# Patient Record
Sex: Female | Born: 1985 | Race: White | Hispanic: No | Marital: Married | Smoking: Never smoker
Health system: Southern US, Community
[De-identification: ages and names within clinical notes are randomized; demographics above are authoritative.]

## PROBLEM LIST (undated history)

## (undated) DIAGNOSIS — K219 Gastro-esophageal reflux disease without esophagitis: Secondary | ICD-10-CM

## (undated) DIAGNOSIS — R000 Tachycardia, unspecified: Secondary | ICD-10-CM

## (undated) DIAGNOSIS — J45909 Unspecified asthma, uncomplicated: Secondary | ICD-10-CM

## (undated) DIAGNOSIS — D649 Anemia, unspecified: Secondary | ICD-10-CM

## (undated) DIAGNOSIS — Z9889 Other specified postprocedural states: Secondary | ICD-10-CM

## (undated) DIAGNOSIS — Z8759 Personal history of other complications of pregnancy, childbirth and the puerperium: Secondary | ICD-10-CM

## (undated) DIAGNOSIS — G43909 Migraine, unspecified, not intractable, without status migrainosus: Secondary | ICD-10-CM

## (undated) DIAGNOSIS — Q2381 Bicuspid aortic valve: Secondary | ICD-10-CM

## (undated) DIAGNOSIS — Q231 Congenital insufficiency of aortic valve: Secondary | ICD-10-CM

## (undated) DIAGNOSIS — R112 Nausea with vomiting, unspecified: Secondary | ICD-10-CM

## (undated) HISTORY — PX: WRIST SURGERY: SHX841

## (undated) HISTORY — PX: FRACTURE SURGERY: SHX138

## (undated) HISTORY — DX: Unspecified asthma, uncomplicated: J45.909

## (undated) HISTORY — DX: Migraine, unspecified, not intractable, without status migrainosus: G43.909

## (undated) HISTORY — PX: TONSILLECTOMY: SUR1361

---
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
## 2005-01-04 ENCOUNTER — Other Ambulatory Visit: Admission: RE | Admit: 2005-01-04 | Discharge: 2005-01-04 | Payer: Self-pay | Admitting: Obstetrics and Gynecology

## 2005-01-15 ENCOUNTER — Other Ambulatory Visit: Admission: RE | Admit: 2005-01-15 | Discharge: 2005-01-15 | Payer: Self-pay | Admitting: Obstetrics and Gynecology

## 2005-07-11 ENCOUNTER — Other Ambulatory Visit: Admission: RE | Admit: 2005-07-11 | Discharge: 2005-07-11 | Payer: Self-pay | Admitting: Obstetrics & Gynecology

## 2006-08-02 ENCOUNTER — Other Ambulatory Visit: Admission: RE | Admit: 2006-08-02 | Discharge: 2006-08-02 | Payer: Self-pay | Admitting: Obstetrics and Gynecology

## 2007-09-29 ENCOUNTER — Other Ambulatory Visit: Admission: RE | Admit: 2007-09-29 | Discharge: 2007-09-29 | Payer: Self-pay | Admitting: Obstetrics and Gynecology

## 2007-11-30 ENCOUNTER — Encounter: Admission: RE | Admit: 2007-11-30 | Discharge: 2007-11-30 | Payer: Self-pay | Admitting: Family Medicine

## 2010-04-16 ENCOUNTER — Encounter: Payer: Self-pay | Admitting: Surgery

## 2012-05-13 ENCOUNTER — Emergency Department (HOSPITAL_COMMUNITY)
Admission: EM | Admit: 2012-05-13 | Discharge: 2012-05-13 | Disposition: A | Discharge status: Home / Self Care | Payer: BC Managed Care – PPO | Attending: Emergency Medicine | Admitting: Emergency Medicine

## 2012-05-13 ENCOUNTER — Emergency Department (HOSPITAL_COMMUNITY): Payer: BC Managed Care – PPO

## 2012-05-13 DIAGNOSIS — F430 Acute stress reaction: Secondary | ICD-10-CM | POA: Insufficient documentation

## 2012-05-13 DIAGNOSIS — R0789 Other chest pain: Secondary | ICD-10-CM | POA: Insufficient documentation

## 2012-05-13 DIAGNOSIS — Z79899 Other long term (current) drug therapy: Secondary | ICD-10-CM | POA: Insufficient documentation

## 2012-05-13 DIAGNOSIS — R61 Generalized hyperhidrosis: Secondary | ICD-10-CM | POA: Insufficient documentation

## 2012-05-13 DIAGNOSIS — G43909 Migraine, unspecified, not intractable, without status migrainosus: Secondary | ICD-10-CM | POA: Insufficient documentation

## 2012-05-13 DIAGNOSIS — Z862 Personal history of diseases of the blood and blood-forming organs and certain disorders involving the immune mechanism: Secondary | ICD-10-CM | POA: Insufficient documentation

## 2012-05-13 DIAGNOSIS — F439 Reaction to severe stress, unspecified: Secondary | ICD-10-CM

## 2012-05-13 DIAGNOSIS — R000 Tachycardia, unspecified: Secondary | ICD-10-CM

## 2012-05-13 DIAGNOSIS — I498 Other specified cardiac arrhythmias: Secondary | ICD-10-CM | POA: Insufficient documentation

## 2012-05-13 DIAGNOSIS — Z8639 Personal history of other endocrine, nutritional and metabolic disease: Secondary | ICD-10-CM | POA: Insufficient documentation

## 2012-05-13 DIAGNOSIS — J45909 Unspecified asthma, uncomplicated: Secondary | ICD-10-CM | POA: Insufficient documentation

## 2012-05-13 LAB — BASIC METABOLIC PANEL
Creatinine, Ser: 0.85 mg/dL (ref 0.50–1.10)
Glucose, Bld: 98 mg/dL (ref 70–99)

## 2012-05-13 LAB — RAPID URINE DRUG SCREEN, HOSP PERFORMED
Barbiturates: NOT DETECTED
Benzodiazepines: NOT DETECTED
Tetrahydrocannabinol: NOT DETECTED

## 2012-05-13 LAB — POCT I-STAT TROPONIN I: Troponin i, poc: 0 ng/mL (ref 0.00–0.08)

## 2012-05-13 MED ORDER — SODIUM CHLORIDE 0.9 % IV BOLUS (SEPSIS)
1000.0000 mL | Freq: Once | INTRAVENOUS | Status: AC
  Administered 2012-05-13: 1000 mL via INTRAVENOUS

## 2012-05-13 MED ORDER — GI COCKTAIL ~~LOC~~
30.0000 mL | Freq: Once | ORAL | Status: AC
  Administered 2012-05-13: 30 mL via ORAL
  Filled 2012-05-13: qty 30

## 2012-05-13 NOTE — ED Notes (Signed)
Given asa and ntg and on arrival 2/10 pain, last year diagnosed with high cholesterol no meds.

## 2012-05-13 NOTE — ED Provider Notes (Signed)
  I performed a history and physical examination of Theresa Lawrence and discussed her management with Dr.McLean.  I agree with the history, physical, assessment, and plan of care, with the following exceptions: None     Theresa Lawrence, Theresa Coil, MD 05/13/12 2206

## 2012-05-13 NOTE — ED Provider Notes (Signed)
History     CSN: [id number]  Arrival date & time 05/13/12  1401   First MD Initiated Contact with Patient 05/13/12 1501      Chief Complaint  Patient presents with  . Chest Pain    (Consider location/radiation/quality/duration/timing/severity/associated sxs/prior treatment) HPI Comments: 27 y.o PMH migraines, dyslipidemia, exercise induced asthma.    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  She describes the discomfort as sharp, pressure, tight max discomfort was 7/10 then went to 4/10.  Discomfort was relieved by Aspirin 325 and NTG, and oxygen given by EMS at 1:30-2 PM.  She also noted this chest discomfort yesterday.  Sensation radiated to left arm and 3rd and 4th fingers of left hand.  She has never had this before.  She denies n/v but reports she was sweating at the time of onset.  Denies sob. She recently had an engagement breakup 3-4 weeks ago.  Per her mother she has been trying to deal with this on her own, decreased appetite, very emotional.  She now lives with her mother.  Patient states she recently was trying to set up an appointment with a counselor.    PCP: [facility]) Meds: noted; denies OCP. Stopped 1 week ago  SH: dance teacher  Patient is a 27 y.o. female presenting with chest pain. The history is provided by the patient and a parent. No language interpreter was used.  Chest Pain Pain location:  L chest (mid chest ) Pain quality: pressure, sharp and tightness   Pain radiates to:  L arm (3rd and 4th fingers left hand ) Pain severity:  No pain (no pain currently since NTG and ASA 325 mg  per EMS which helped ) Onset quality:  Sudden Progression:  Resolved Chronicity:  New Context: stress   Context: not breathing   Relieved by:  Oxygen, nitroglycerin and aspirin Associated symptoms: diaphoresis   Associated symptoms: no abdominal pain, no anxiety, no nausea, no shortness of  breath and not vomiting   Risk factors: high cholesterol   Risk factors: no birth control and no smoking     PMH: migraines, exercise induced asthma, dyslipidemia   PsuH: tonsillectomy and adenoidectomy age 41  SH: Denies cigarettes  Medications: see chart for meds FH: father cabg 56 y.o, maternal GF history of MI  History  Substance Use Topics  . Smoking status: Not on file  . Smokeless tobacco: Not on file  . Alcohol Use: Not on file    OB History   No data available      Review of Systems  Constitutional: Positive for diaphoresis.  HENT: Negative for sore throat.   Respiratory: Negative for shortness of breath.   Cardiovascular: Positive for chest pain.  Gastrointestinal: Negative for nausea, vomiting and abdominal pain.  Psychiatric/Behavioral:       Stressors   All other systems reviewed and are negative.    Allergies  Other and Sulfa antibiotics  Home Medications   Current Outpatient Rx  Name  Route  Sig  Dispense  Refill  . albuterol (PROVENTIL HFA;VENTOLIN HFA) 108 (90 BASE) MCG/ACT inhaler   Inhalation   Inhale into the lungs every 6 (six) hours as needed for wheezing.         Marland Kitchen imipramine (TOFRANIL) 50 MG tablet   Oral   Take 100 mg by mouth at bedtime.         Marland Kitchen ketoprofen (  ORUDIS) 75 MG capsule   Oral   Take 75 mg by mouth 3 (three) times daily as needed for pain.         Marland Kitchen metoCLOPramide (REGLAN) 10 MG tablet   Oral   Take 10 mg by mouth 4 (four) times daily as needed. Takes when takes Imitrex         . SUMAtriptan (IMITREX) 100 MG tablet   Oral   Take 100 mg by mouth every 2 (two) hours as needed for migraine.           BP 120/87  Pulse 109  Temp(Src) 98 F (36.7 C) (Oral)  Resp 15  SpO2 100%  LMP 04/29/2012  Physical Exam  Nursing note and vitals reviewed. Constitutional: She is oriented to person, place, and time. She appears well-developed and well-nourished. She is cooperative. No distress.  HENT:  Head:  Normocephalic and atraumatic.  Mouth/Throat: Oropharynx is clear and moist and mucous membranes are normal. No oropharyngeal exudate.  Eyes: Conjunctivae are normal. Pupils are equal, round, and reactive to light. Right eye exhibits no discharge. Left eye exhibits no discharge. No scleral icterus.  Cardiovascular: Normal rate, regular rhythm, S1 normal, S2 normal and normal heart sounds.   No murmur heard. Pulmonary/Chest: Effort normal and breath sounds normal. No respiratory distress. She has no wheezes.  Reproducible chest wall ttp mild mid to left chest   Abdominal: Soft. Bowel sounds are normal. There is no tenderness.  Neurological: She is alert and oriented to person, place, and time. She has normal strength.  Skin: Skin is warm, dry and intact. No rash noted. She is not diaphoretic.  Psychiatric: She has a normal mood and affect. Her speech is normal and behavior is normal. Judgment and thought content normal. Cognition and memory are normal.  Somewhat tearful when discussing recent break off of engagement    ED Course  Procedures (including critical care time)  Labs Reviewed  BASIC METABOLIC PANEL  URINE RAPID DRUG SCREEN (HOSP PERFORMED)  POCT I-STAT TROPONIN I   Dg Chest 2 View  05/13/2012  *RADIOLOGY REPORT*  Clinical Data: Chest pain  CHEST - 2 VIEW  Comparison: None.  Findings:  Lungs clear.  Heart size and pulmonary vascularity are normal.  No adenopathy.  No pneumothorax.  No bone lesions.  IMPRESSION: No abnormality noted.   Original Report Authenticated By: Bretta Bang, M.D.      1. Chest discomfort, likely noncardiac   2. Stress   3. Sinus tachycardia       Date: 05/13/2012  Rate: 101  Rhythm: sinus tachycardia  QRS Axis: normal  Intervals: normal  ST/T Wave abnormalities: normal  Conduction Disutrbances:none  Narrative Interpretation:   Old EKG Reviewed: none available   MDM  Chest discomfort likely noncardiac (related to anxiety or  stressors) Low TIMI and PERC score risk  No tachypenea, no hypoxia, no pleuritic chest pain noted  BMET, istat trop, EKG F/u with PCP and/or psych or counselor Discharge VS: HR:109  RR:15  O2 sat:100% BP 120/87  Sinus tachycardia Likely related to anxiety, stress, dehydration with decreased oral intake recently  1 L NS bolus   Shirlee Latch MD [phone]         Annett Gula, MD 05/13/12 1641  Annett Gula, MD 05/13/12 1641  Annett Gula, MD 05/13/12 1645  Annett Gula, MD 05/13/12 1702  Annett Gula, MD 05/13/12 1752

## 2012-05-13 NOTE — ED Notes (Signed)
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 12 lead shows ST, hx of asthma.

## 2012-06-17 ENCOUNTER — Ambulatory Visit: Payer: BC Managed Care – PPO

## 2012-06-17 DIAGNOSIS — N390 Urinary tract infection, site not specified: Secondary | ICD-10-CM

## 2012-06-19 LAB — URINE CULTURE
Colony Count: NO GROWTH
Organism ID, Bacteria: NO GROWTH

## 2012-07-18 ENCOUNTER — Encounter: Payer: Self-pay | Admitting: Gynecology

## 2012-07-18 ENCOUNTER — Other Ambulatory Visit: Payer: Self-pay | Admitting: Gynecology

## 2012-07-18 ENCOUNTER — Ambulatory Visit (INDEPENDENT_AMBULATORY_CARE_PROVIDER_SITE_OTHER): Payer: BC Managed Care – PPO | Admitting: Gynecology

## 2012-07-18 VITALS — BP 118/60

## 2012-07-18 DIAGNOSIS — G43909 Migraine, unspecified, not intractable, without status migrainosus: Secondary | ICD-10-CM | POA: Insufficient documentation

## 2012-07-18 DIAGNOSIS — N912 Amenorrhea, unspecified: Secondary | ICD-10-CM

## 2012-07-18 DIAGNOSIS — Q249 Congenital malformation of heart, unspecified: Secondary | ICD-10-CM

## 2012-07-18 DIAGNOSIS — Q239 Congenital malformation of aortic and mitral valves, unspecified: Secondary | ICD-10-CM

## 2012-07-18 MED ORDER — CONCEPT DHA 53.5-38-1 MG PO CAPS: 1.0000 | ORAL_CAPSULE | Freq: Every day | ORAL | Status: DC

## 2012-07-18 MED ORDER — CONCEPT OB 130-92.4-1 MG PO CAPS: 1.0000 | ORAL_CAPSULE | Freq: Every day | ORAL | Status: DC

## 2012-07-18 NOTE — Patient Instructions (Signed)

## 2012-07-18 NOTE — Progress Notes (Signed)
  Subjective:    Theresa Lawrence is a 27 y.o. female who presents for evaluation of amenorrhea. She believes she could be pregnant. Pregnancy is desired. Sexual Activity: single partner, contraception: none. Pt had stopped birth control pills was planning skyla placement.  Pt happy but unplanned. Current symptoms also include: fatigue, morning sickness, nausea and positive home pregnancy test. Last period was normal. But was withdraw bleed from ocp.   Pt was on imitrex and imipramine which she stopped with +UPT.  Pt has histroy of migraines.  She is currently wearing a heart monitor due to an irregular heart rate-Pt diagnosised with bicuspid mitral valve.  Pt is currently seeing cardiology   Patient's last menstrual period was 05/24/2012. The following portions of the patient's history were reviewed and updated as appropriate: allergies, current medications, past family history, past medical history, past social history, past surgical history and problem list.  Review of Systems Pertinent items are noted in HPI.     Objective:    BP 118/60  LMP 05/24/2012 General: alert, cooperative and no acute distress    Lab Review Urine HCG: positive    Assessment:    Absence of menstruation.     Plan:    Pregnancy Test: Positive: EDC: 03/06/2014. Briefly discussed pre-natal care options. Pregnancy, Childbirth and the Newborn book given. Encouraged well-balanced diet, plenty of rest when needed, pre-natal vitamins daily and walking for exercise. Discussed self-help for nausea, avoiding OTC medications until consulting provider or pharmacist, other than Tylenol as needed, minimal caffeine (1-2 cups daily) and avoiding alcohol. She will schedule her initial OB visit in the next month with her PCP or OB provider. Feel free to call with any questions. Pt needs to complete cardiology evaluation to determine appropriate md for care   Pt will get quant HCG here, u/s to confirm viability, will bring results from  cardiology study at follow up, may require perinatology   Recommend trying Vit B6/ 1/2 unisom for nausea. All questions addressed Length of visit , >50% face to face

## 2012-07-21 ENCOUNTER — Telehealth: Payer: Self-pay | Admitting: *Deleted

## 2012-07-21 NOTE — Telephone Encounter (Signed)
Message copied by Lorraine Lax on Mon Jul 21, 2012  4:07 PM ------      Message from: Douglass Rivers      Created: Mon Jul 21, 2012 12:00 PM       Inform BHCG looks great, we will schedule dating u/s ------

## 2012-07-21 NOTE — Telephone Encounter (Signed)
Left Message To Call Back  

## 2012-07-22 NOTE — Telephone Encounter (Signed)
Informed pt of labs.  

## 2012-07-23 ENCOUNTER — Other Ambulatory Visit: Payer: BC Managed Care – PPO | Admitting: Gynecology

## 2012-07-23 ENCOUNTER — Other Ambulatory Visit: Payer: BC Managed Care – PPO

## 2012-07-23 ENCOUNTER — Telehealth: Payer: Self-pay | Admitting: Gynecology

## 2012-07-23 NOTE — Telephone Encounter (Signed)
Pt had to leave because the ultrasound was over an hour behind please call her @[phone] pt has already paid fee for ultrasound appointment.

## 2012-07-23 NOTE — Telephone Encounter (Signed)
Patient would like a referral to an OBGYN.

## 2012-07-23 NOTE — Telephone Encounter (Signed)
Patient requesting a referral to OBGYN on [facility] . Came today for [place] but had to leave b/c could not wait for test . Was told us was hour behind schedule. Patient is wanting appt. As Soon As Possible due to her heart condition and need to know how far along she is. Please advise. sue

## 2012-07-23 NOTE — Telephone Encounter (Signed)
Sorry about the u/s today, she can see Dr Thana Ates, pls find out what the cardiologist said, she might need to see MFM in [facility] only

## 2012-07-24 NOTE — Telephone Encounter (Signed)
Patient notified of Dr. Farrel Gobble response to see Dr. Thana Ates. Patient did call cardiologist office and was told at this point does not need to see a specialist. States is waiting to get appt. With cardiologist. Gave name of [facility]aternal Fetal Medicine, Dr. Rachel Bo, if needed.

## 2012-08-05 NOTE — Telephone Encounter (Signed)
LMTCB to reschedule her PUS for viability.

## 2012-08-11 ENCOUNTER — Inpatient Hospital Stay (HOSPITAL_COMMUNITY): Payer: BC Managed Care – PPO

## 2012-08-11 ENCOUNTER — Inpatient Hospital Stay (HOSPITAL_COMMUNITY)
Admission: AD | Admit: 2012-08-11 | Discharge: 2012-08-11 | Disposition: A | Discharge status: Home / Self Care | Payer: BC Managed Care – PPO | Source: Ambulatory Visit | Attending: Obstetrics & Gynecology | Admitting: Obstetrics & Gynecology

## 2012-08-11 ENCOUNTER — Encounter (HOSPITAL_COMMUNITY): Payer: Self-pay | Admitting: *Deleted

## 2012-08-11 DIAGNOSIS — O200 Threatened abortion: Secondary | ICD-10-CM

## 2012-08-11 DIAGNOSIS — Z8759 Personal history of other complications of pregnancy, childbirth and the puerperium: Secondary | ICD-10-CM

## 2012-08-11 DIAGNOSIS — O039 Complete or unspecified spontaneous abortion without complication: Secondary | ICD-10-CM

## 2012-08-11 HISTORY — DX: Personal history of other complications of pregnancy, childbirth and the puerperium: Z87.59

## 2012-08-11 LAB — CBC
HCT: 35.6 % — ABNORMAL LOW (ref 36.0–46.0)
Hemoglobin: 12 g/dL (ref 12.0–15.0)
MCH: 29.9 pg (ref 26.0–34.0)
MCV: 88.6 fL (ref 78.0–100.0)
Platelets: 236 10*3/uL (ref 150–400)
RBC: 4.02 MIL/uL (ref 3.87–5.11)

## 2012-08-11 LAB — HCG, QUANTITATIVE, PREGNANCY: hCG, Beta Chain, Quant, S: 245 m[IU]/mL — ABNORMAL HIGH (ref <5)

## 2012-08-11 LAB — ABO/RH: ABO/RH(D): O POS

## 2012-08-11 NOTE — MAU Provider Note (Cosign Needed)
History     CSN: [id number]  Arrival date and time: 08/11/12 1658   None     Chief Complaint  Patient presents with  . Abdominal Pain  . Gush of fluid with blood    HPI  Theresa Lawrence is a 27 y.o. G2P0010 at [redacted]w[redacted]d who presents today with vaginal bleeding. She states that she was seen in the office 2 weeks ago, and had a viable IUP seen on ultrasound. The on the Saturday after that ultrasound she has some light spotting. She states that she had called the office, and was told to watch the bleeding. She then did not have any more bleeding or pain. She states today she had "intense abdominal pain", and she went to the bathroom and had  A large gush of fluid and blood and then the pain was better.   Her and her partner are both very upset about this, and are wondering if there anything they can do to know why this has happened twice now.   Past Medical History  Diagnosis Date  . Migraine   . Asthma     Past Surgical History  Procedure Laterality Date  . Tonsillectomy      Family History  Problem Relation Age of Onset  . Kidney disease Father     History  Substance Use Topics  . Smoking status: Not on file  . Smokeless tobacco: Not on file  . Alcohol Use: No    Allergies:  Allergies  Allergen Reactions  . Other Rash    Allergic reaction to pediazole as an infant.  . Sulfa Antibiotics Rash    Prescriptions prior to admission  Medication Sig Dispense Refill  . acetaminophen (TYLENOL) 500 MG tablet Take 500 mg by mouth every 6 (six) hours as needed for pain (For headache.).      Marland Kitchen Prenatal Vit-Fe Fumarate-FA (PRENATAL MULTIVITAMIN) TABS Take 1 tablet by mouth at bedtime.        Review of Systems  Constitutional: Negative for fever.  Eyes: Negative for blurred vision.  Respiratory: Negative for shortness of breath.   Cardiovascular: Negative for chest pain.  Gastrointestinal: Positive for abdominal pain. Negative for nausea, vomiting, diarrhea and constipation.    Genitourinary: Negative for dysuria, urgency and frequency.  Musculoskeletal: Negative for myalgias.  Neurological: Negative for dizziness and headaches.   Physical Exam   Blood pressure 125/85, pulse 102, temperature 97.6 F (36.4 C), temperature source Oral, resp. rate 18, height 5\' 3"  (1.6 m), weight 51.075 kg (112 lb 9.6 oz), last menstrual period 05/24/2012.  Physical Exam  Constitutional: She is oriented to person, place, and time. She appears well-developed and well-nourished. No distress.  Cardiovascular: Normal rate.   Respiratory: Effort normal.  GI: Soft.  Neurological: She is alert and oriented to person, place, and time.  Skin: Skin is warm and dry.  Psychiatric: She has a normal mood and affect.  Tearful     MAU Course  Procedures  Results for orders placed during the hospital encounter of 08/11/12 (from the past 24 hour(s))  CBC     Status: Abnormal   Collection Time    08/11/12  5:25 PM      Result Value Range   WBC 6.2  4.0 - 10.5 K/uL   RBC 4.02  3.87 - 5.11 MIL/uL   Hemoglobin 12.0  12.0 - 15.0 g/dL   HCT 56.2 (*) 13.0 - 86.5 %   MCV 88.6  78.0 - 100.0 fL   MCH  29.9  26.0 - 34.0 pg   MCHC 33.7  30.0 - 36.0 g/dL   RDW 16.1  09.6 - 04.5 %   Platelets 236  150 - 400 K/uL  ABO/RH     Status: None   Collection Time    08/11/12  5:25 PM      Result Value Range   ABO/RH(D) O POS    HCG, QUANTITATIVE, PREGNANCY     Status: Abnormal   Collection Time    08/11/12  5:25 PM      Result Value Range   hCG, Beta Chain, Quant, S 245 (*) <5 mIU/mL     Spoke with Dr. Arlyce Dice. Have her FU in the office. He will come by MAU and see the patient since he is here in the hospital .  Assessment and Plan   1. Threatened abortion, antepartum   2. Complete miscarriage    FU with the office  Bleeding precautions reviewed  Tawnya Crook 08/11/2012, 6:25 PM

## 2012-08-11 NOTE — MAU Provider Note (Signed)
Patient presents to MAU with symptoms consistent with spontaneous ab.  On 5/5 she had a vaginal ultrasound which confirmed a living 8 week embryo.  She has a history of a previous spont. Ab at 6 weeks.  Ultrasound today:  Empty uterus consistent with complete spontaneous ab.  Imp:  Spontaneous ab.  Plan:  Follow up with [place] or primary gyn in 2 - 4 weeks.  OTC meds for pain.

## 2012-08-11 NOTE — MAU Note (Addendum)
Feeling lower abdominal pressure. Patient went to bathroom today at 1600 and states she had a gush of fluid with blood in it.  Abdominal pain was relieved after going to the bathroom.

## 2012-08-13 ENCOUNTER — Encounter (HOSPITAL_COMMUNITY): Payer: Self-pay | Admitting: Anesthesiology

## 2012-08-13 ENCOUNTER — Inpatient Hospital Stay (HOSPITAL_COMMUNITY): Payer: BC Managed Care – PPO

## 2012-08-13 ENCOUNTER — Encounter (HOSPITAL_COMMUNITY): Payer: Self-pay | Admitting: Radiology

## 2012-08-13 ENCOUNTER — Encounter (HOSPITAL_COMMUNITY): Admission: EM | Disposition: A | Discharge status: Home Health | Payer: Self-pay | Source: Home / Self Care

## 2012-08-13 ENCOUNTER — Inpatient Hospital Stay (HOSPITAL_COMMUNITY): Payer: BC Managed Care – PPO | Admitting: Anesthesiology

## 2012-08-13 ENCOUNTER — Inpatient Hospital Stay (HOSPITAL_COMMUNITY)
Admission: EM | Admit: 2012-08-13 | Discharge: 2012-08-19 | DRG: 210 | Disposition: A | Discharge status: Home Health | Payer: BC Managed Care – PPO | Attending: General Surgery | Admitting: General Surgery

## 2012-08-13 ENCOUNTER — Emergency Department (HOSPITAL_COMMUNITY): Payer: BC Managed Care – PPO

## 2012-08-13 DIAGNOSIS — T24299A Burn of second degree of multiple sites of unspecified lower limb, except ankle and foot, initial encounter: Secondary | ICD-10-CM

## 2012-08-13 DIAGNOSIS — T2000XA Burn of unspecified degree of head, face, and neck, unspecified site, initial encounter: Secondary | ICD-10-CM

## 2012-08-13 DIAGNOSIS — R000 Tachycardia, unspecified: Secondary | ICD-10-CM

## 2012-08-13 DIAGNOSIS — T25021A Burn of unspecified degree of right foot, initial encounter: Secondary | ICD-10-CM

## 2012-08-13 DIAGNOSIS — D62 Acute posthemorrhagic anemia: Secondary | ICD-10-CM

## 2012-08-13 DIAGNOSIS — T23229A Burn of second degree of unspecified single finger (nail) except thumb, initial encounter: Secondary | ICD-10-CM | POA: Diagnosis present

## 2012-08-13 DIAGNOSIS — S51809A Unspecified open wound of unspecified forearm, initial encounter: Secondary | ICD-10-CM | POA: Diagnosis present

## 2012-08-13 DIAGNOSIS — G43909 Migraine, unspecified, not intractable, without status migrainosus: Secondary | ICD-10-CM

## 2012-08-13 DIAGNOSIS — Z79899 Other long term (current) drug therapy: Secondary | ICD-10-CM

## 2012-08-13 DIAGNOSIS — I498 Other specified cardiac arrhythmias: Secondary | ICD-10-CM | POA: Diagnosis present

## 2012-08-13 DIAGNOSIS — T310 Burns involving less than 10% of body surface: Secondary | ICD-10-CM | POA: Diagnosis present

## 2012-08-13 DIAGNOSIS — S52202A Unspecified fracture of shaft of left ulna, initial encounter for closed fracture: Secondary | ICD-10-CM

## 2012-08-13 DIAGNOSIS — S7290XA Unspecified fracture of unspecified femur, initial encounter for closed fracture: Secondary | ICD-10-CM

## 2012-08-13 DIAGNOSIS — T2124XA Burn of second degree of lower back, initial encounter: Secondary | ICD-10-CM

## 2012-08-13 DIAGNOSIS — S52209A Unspecified fracture of shaft of unspecified ulna, initial encounter for closed fracture: Secondary | ICD-10-CM

## 2012-08-13 DIAGNOSIS — T300 Burn of unspecified body region, unspecified degree: Secondary | ICD-10-CM

## 2012-08-13 DIAGNOSIS — T24001A Burn of unspecified degree of unspecified site of right lower limb, except ankle and foot, initial encounter: Secondary | ICD-10-CM

## 2012-08-13 DIAGNOSIS — S7291XA Unspecified fracture of right femur, initial encounter for closed fracture: Secondary | ICD-10-CM

## 2012-08-13 DIAGNOSIS — IMO0002 Reserved for concepts with insufficient information to code with codable children: Secondary | ICD-10-CM | POA: Diagnosis present

## 2012-08-13 DIAGNOSIS — E860 Dehydration: Secondary | ICD-10-CM | POA: Diagnosis present

## 2012-08-13 DIAGNOSIS — R110 Nausea: Secondary | ICD-10-CM | POA: Diagnosis not present

## 2012-08-13 DIAGNOSIS — S72309A Unspecified fracture of shaft of unspecified femur, initial encounter for closed fracture: Principal | ICD-10-CM | POA: Diagnosis present

## 2012-08-13 DIAGNOSIS — S0180XA Unspecified open wound of other part of head, initial encounter: Secondary | ICD-10-CM | POA: Diagnosis present

## 2012-08-13 DIAGNOSIS — Y998 Other external cause status: Secondary | ICD-10-CM

## 2012-08-13 DIAGNOSIS — T2026XA Burn of second degree of forehead and cheek, initial encounter: Secondary | ICD-10-CM | POA: Diagnosis present

## 2012-08-13 DIAGNOSIS — S0101XA Laceration without foreign body of scalp, initial encounter: Secondary | ICD-10-CM

## 2012-08-13 DIAGNOSIS — Y92410 Unspecified street and highway as the place of occurrence of the external cause: Secondary | ICD-10-CM

## 2012-08-13 DIAGNOSIS — S52609A Unspecified fracture of lower end of unspecified ulna, initial encounter for closed fracture: Secondary | ICD-10-CM | POA: Diagnosis present

## 2012-08-13 DIAGNOSIS — Q231 Congenital insufficiency of aortic valve: Secondary | ICD-10-CM

## 2012-08-13 DIAGNOSIS — R55 Syncope and collapse: Secondary | ICD-10-CM

## 2012-08-13 DIAGNOSIS — J45909 Unspecified asthma, uncomplicated: Secondary | ICD-10-CM

## 2012-08-13 HISTORY — PX: FEMUR IM NAIL: SHX1597

## 2012-08-13 HISTORY — DX: Tachycardia, unspecified: R00.0

## 2012-08-13 HISTORY — DX: Congenital insufficiency of aortic valve: Q23.1

## 2012-08-13 HISTORY — DX: Personal history of other complications of pregnancy, childbirth and the puerperium: Z87.59

## 2012-08-13 HISTORY — DX: Bicuspid aortic valve: Q23.81

## 2012-08-13 HISTORY — PX: BURN TREATMENT: PRO154

## 2012-08-13 LAB — POCT I-STAT, CHEM 8
BUN: 19 mg/dL (ref 6–23)
Chloride: 106 mEq/L (ref 96–112)
Creatinine, Ser: 0.9 mg/dL (ref 0.50–1.10)
Sodium: 140 mEq/L (ref 135–145)

## 2012-08-13 LAB — COMPREHENSIVE METABOLIC PANEL
Alkaline Phosphatase: 66 U/L (ref 39–117)
BUN: 16 mg/dL (ref 6–23)
Creatinine, Ser: 0.81 mg/dL (ref 0.50–1.10)
GFR calc Af Amer: >90 mL/min (ref >90)
Glucose, Bld: 125 mg/dL — ABNORMAL HIGH (ref 70–99)
Potassium: 4 mEq/L (ref 3.5–5.1)
Total Protein: 6.5 g/dL (ref 6.0–8.3)

## 2012-08-13 LAB — CG4 I-STAT (LACTIC ACID): Lactic Acid, Venous: 1.62 mmol/L (ref 0.5–2.2)

## 2012-08-13 LAB — SAMPLE TO BLOOD BANK

## 2012-08-13 LAB — CBC
HCT: 33.8 % — ABNORMAL LOW (ref 36.0–46.0)
Platelets: 251 10*3/uL (ref 150–400)
RBC: 3.85 MIL/uL — ABNORMAL LOW (ref 3.87–5.11)
RDW: 13.4 % (ref 11.5–15.5)
WBC: 10 10*3/uL (ref 4.0–10.5)

## 2012-08-13 LAB — PROTIME-INR: INR: 1.06 (ref 0.00–1.49)

## 2012-08-13 SURGERY — INSERTION, INTRAMEDULLARY ROD, FEMUR
Anesthesia: General | Site: Leg Upper | Laterality: Right | Wound class: Clean

## 2012-08-13 MED ORDER — ONDANSETRON HCL 4 MG/2ML IJ SOLN: 4.0000 mg | Freq: Once | INTRAMUSCULAR | Status: AC | PRN

## 2012-08-13 MED ORDER — IOHEXOL 300 MG/ML  SOLN
100.0000 mL | Freq: Once | INTRAMUSCULAR | Status: AC | PRN
  Administered 2012-08-13: 100 mL via INTRAVENOUS

## 2012-08-13 MED ORDER — HYDROMORPHONE HCL PF 1 MG/ML IJ SOLN
INTRAMUSCULAR | Status: AC
  Filled 2012-08-13: qty 1

## 2012-08-13 MED ORDER — LIDOCAINE HCL (CARDIAC) 20 MG/ML IV SOLN
INTRAVENOUS | Status: DC | PRN
  Administered 2012-08-13: 100 mg via INTRAVENOUS

## 2012-08-13 MED ORDER — NEOSTIGMINE METHYLSULFATE 1 MG/ML IJ SOLN
INTRAMUSCULAR | Status: DC | PRN
  Administered 2012-08-13: 3 mg via INTRAVENOUS

## 2012-08-13 MED ORDER — PHENYLEPHRINE HCL 10 MG/ML IJ SOLN
INTRAMUSCULAR | Status: DC | PRN
  Administered 2012-08-13 (×5): 80 ug via INTRAVENOUS

## 2012-08-13 MED ORDER — MEPERIDINE HCL 25 MG/ML IJ SOLN: 6.2500 mg | INTRAMUSCULAR | Status: DC | PRN

## 2012-08-13 MED ORDER — GLYCOPYRROLATE 0.2 MG/ML IJ SOLN
INTRAMUSCULAR | Status: DC | PRN
  Administered 2012-08-13: 0.4 mg via INTRAVENOUS

## 2012-08-13 MED ORDER — CEFAZOLIN SODIUM-DEXTROSE 2-3 GM-% IV SOLR
INTRAVENOUS | Status: DC | PRN
  Administered 2012-08-13: 2 g via INTRAVENOUS

## 2012-08-13 MED ORDER — MIDAZOLAM HCL 2 MG/2ML IJ SOLN
INTRAMUSCULAR | Status: AC
  Filled 2012-08-13: qty 2

## 2012-08-13 MED ORDER — HYDROMORPHONE HCL PF 1 MG/ML IJ SOLN
0.5000 mg | Freq: Once | INTRAMUSCULAR | Status: AC
  Administered 2012-08-13: 0.5 mg via INTRAVENOUS

## 2012-08-13 MED ORDER — PROPOFOL 10 MG/ML IV BOLUS
INTRAVENOUS | Status: DC | PRN
  Administered 2012-08-13: 100 mg via INTRAVENOUS

## 2012-08-13 MED ORDER — SUFENTANIL CITRATE 50 MCG/ML IV SOLN
INTRAVENOUS | Status: DC | PRN
  Administered 2012-08-13: 10 ug via INTRAVENOUS
  Administered 2012-08-13: 20 ug via INTRAVENOUS

## 2012-08-13 MED ORDER — PHENYLEPHRINE HCL 10 MG/ML IJ SOLN
10.0000 mg | INTRAVENOUS | Status: DC | PRN
  Administered 2012-08-13: 30 ug/min via INTRAVENOUS

## 2012-08-13 MED ORDER — MIDAZOLAM HCL 2 MG/2ML IJ SOLN
1.0000 mg | Freq: Once | INTRAMUSCULAR | Status: AC
  Administered 2012-08-13: 1 mg via INTRAVENOUS

## 2012-08-13 MED ORDER — LIDOCAINE HCL 4 % MT SOLN
OROMUCOSAL | Status: DC | PRN
  Administered 2012-08-13: 4 mL via TOPICAL

## 2012-08-13 MED ORDER — 0.9 % SODIUM CHLORIDE (POUR BTL) OPTIME
TOPICAL | Status: DC | PRN
  Administered 2012-08-13 (×2): 1000 mL

## 2012-08-13 MED ORDER — OXYCODONE HCL 5 MG/5ML PO SOLN: 5.0000 mg | Freq: Once | ORAL | Status: AC | PRN

## 2012-08-13 MED ORDER — ONDANSETRON HCL 4 MG/2ML IJ SOLN
INTRAMUSCULAR | Status: DC | PRN
  Administered 2012-08-13: 4 mg via INTRAVENOUS

## 2012-08-13 MED ORDER — ACETAMINOPHEN 10 MG/ML IV SOLN
INTRAVENOUS | Status: DC | PRN
  Administered 2012-08-13: 1000 mg via INTRAVENOUS

## 2012-08-13 MED ORDER — HYDROMORPHONE HCL PF 1 MG/ML IJ SOLN
0.2500 mg | INTRAMUSCULAR | Status: DC | PRN
  Administered 2012-08-13 (×2): 0.25 mg via INTRAVENOUS

## 2012-08-13 MED ORDER — MIDAZOLAM HCL 5 MG/5ML IJ SOLN
INTRAMUSCULAR | Status: DC | PRN
  Administered 2012-08-13: 2 mg via INTRAVENOUS

## 2012-08-13 MED ORDER — SODIUM CHLORIDE 0.9 % IV SOLN
INTRAVENOUS | Status: DC | PRN
  Administered 2012-08-13: 19:00:00 via INTRAVENOUS

## 2012-08-13 MED ORDER — HYDROMORPHONE HCL PF 1 MG/ML IJ SOLN
1.0000 mg | Freq: Once | INTRAMUSCULAR | Status: AC
  Administered 2012-08-13: 1 mg via INTRAVENOUS
  Filled 2012-08-13: qty 1

## 2012-08-13 MED ORDER — ROCURONIUM BROMIDE 100 MG/10ML IV SOLN
INTRAVENOUS | Status: DC | PRN
  Administered 2012-08-13: 30 mg via INTRAVENOUS
  Administered 2012-08-13: 20 mg via INTRAVENOUS

## 2012-08-13 MED ORDER — LACTATED RINGERS IV SOLN
INTRAVENOUS | Status: DC | PRN
  Administered 2012-08-13 (×2): via INTRAVENOUS

## 2012-08-13 MED ORDER — SUCCINYLCHOLINE CHLORIDE 20 MG/ML IJ SOLN
INTRAMUSCULAR | Status: DC | PRN
  Administered 2012-08-13: 140 mg via INTRAVENOUS

## 2012-08-13 MED ORDER — DEXAMETHASONE SODIUM PHOSPHATE 4 MG/ML IJ SOLN
INTRAMUSCULAR | Status: DC | PRN
  Administered 2012-08-13: 4 mg via INTRAVENOUS

## 2012-08-13 MED ORDER — OXYCODONE HCL 5 MG PO TABS: 5.0000 mg | ORAL_TABLET | Freq: Once | ORAL | Status: AC | PRN

## 2012-08-13 SURGICAL SUPPLY — 54 items
ADH SKN CLS APL DERMABOND .7 (GAUZE/BANDAGES/DRESSINGS) ×1
BIT DRILL 3.8X6 NS (BIT) ×1 IMPLANT
BIT DRILL 5.3 (BIT) ×1 IMPLANT
CLOTH BEACON ORANGE TIMEOUT ST (SAFETY) ×2 IMPLANT
COVER MAYO STAND STRL (DRAPES) ×1 IMPLANT
DERMABOND ADVANCED (GAUZE/BANDAGES/DRESSINGS) ×1
DERMABOND ADVANCED .7 DNX12 (GAUZE/BANDAGES/DRESSINGS) IMPLANT
DRAPE EENT NEONATAL 1202 (DRAPE) ×1 IMPLANT
DRAPE STERI IOBAN 125X83 (DRAPES) ×2 IMPLANT
DRAPE SURG 17X23 STRL (DRAPES) ×1 IMPLANT
DRSG ADAPTIC 3X8 NADH LF (GAUZE/BANDAGES/DRESSINGS) ×1 IMPLANT
DRSG AQUACEL AG ADV 3.5X10 (GAUZE/BANDAGES/DRESSINGS) ×1 IMPLANT
DRSG MEPILEX BORDER 4X12 (GAUZE/BANDAGES/DRESSINGS) IMPLANT
DRSG MEPILEX BORDER 4X4 (GAUZE/BANDAGES/DRESSINGS) ×2 IMPLANT
DRSG MEPILEX BORDER 4X8 (GAUZE/BANDAGES/DRESSINGS) IMPLANT
DRSG TEGADERM 4X4.75 (GAUZE/BANDAGES/DRESSINGS) ×1 IMPLANT
DURAPREP 26ML APPLICATOR (WOUND CARE) ×2 IMPLANT
ELECT REM PT RETURN 9FT ADLT (ELECTROSURGICAL) ×2
ELECTRODE REM PT RTRN 9FT ADLT (ELECTROSURGICAL) ×1 IMPLANT
EVACUATOR 1/8 PVC DRAIN (DRAIN) IMPLANT
GLOVE BIOGEL PI IND STRL 7.5 (GLOVE) ×1 IMPLANT
GLOVE BIOGEL PI IND STRL 8 (GLOVE) ×1 IMPLANT
GLOVE BIOGEL PI INDICATOR 7.5 (GLOVE) ×1
GLOVE BIOGEL PI INDICATOR 8 (GLOVE) ×1
GLOVE ORTHO TXT STRL SZ7.5 (GLOVE) ×3 IMPLANT
GLOVE SURG ORTHO 8.0 STRL STRW (GLOVE) ×2 IMPLANT
GOWN STRL NON-REIN LRG LVL3 (GOWN DISPOSABLE) ×3 IMPLANT
GUIDEPIN 3.2X17.5 THRD DISP (PIN) ×1 IMPLANT
GUIDEWIRE BALL NOSE 80CM (WIRE) ×1 IMPLANT
KIT BASIN OR (CUSTOM PROCEDURE TRAY) ×2 IMPLANT
KIT ROOM TURNOVER OR (KITS) ×2 IMPLANT
MANIFOLD NEPTUNE II (INSTRUMENTS) ×1 IMPLANT
NS IRRIG 1000ML POUR BTL (IV SOLUTION) ×3 IMPLANT
PACK GENERAL/GYN (CUSTOM PROCEDURE TRAY) ×2 IMPLANT
PAD ARMBOARD 7.5X6 YLW CONV (MISCELLANEOUS) ×5 IMPLANT
SCREW ACE CORTICAL (Screw) ×4 IMPLANT
SCREW ACECAP 32MM (Screw) ×1 IMPLANT
SCREW BN FT 60X6.5XST DRV (Screw) IMPLANT
SCREW BN FT 65X6.5XST DRV (Screw) IMPLANT
SCREWDRIVER HEX TIP 3.5MM (MISCELLANEOUS) ×1 IMPLANT
SPONGE GAUZE 4X4 12PLY (GAUZE/BANDAGES/DRESSINGS) ×1 IMPLANT
STAPLER VISISTAT 35W (STAPLE) ×2 IMPLANT
SUT MNCRL AB 4-0 PS2 18 (SUTURE) ×1 IMPLANT
SUT PROLENE 3 0 PS 2 (SUTURE) ×1 IMPLANT
SUT VIC AB 0 CT1 27 (SUTURE) ×2
SUT VIC AB 0 CT1 27XBRD ANBCTR (SUTURE) ×2 IMPLANT
SUT VIC AB 1 CT1 27 (SUTURE) ×2
SUT VIC AB 1 CT1 27XBRD ANBCTR (SUTURE) ×1 IMPLANT
SUT VIC AB 2-0 CT1 27 (SUTURE) ×2
SUT VIC AB 2-0 CT1 TAPERPNT 27 (SUTURE) ×1 IMPLANT
TOWEL OR 17X24 6PK STRL BLUE (TOWEL DISPOSABLE) ×2 IMPLANT
TOWEL OR 17X26 10 PK STRL BLUE (TOWEL DISPOSABLE) ×2 IMPLANT
VERSANAIL- RIGHT 9MM X32CM (Nail) ×1 IMPLANT
WATER STERILE IRR 1000ML POUR (IV SOLUTION) ×1 IMPLANT
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
## 2012-08-13 NOTE — Progress Notes (Signed)
Orthopedic Tech Progress Note Patient Details:  Theresa Lawrence 1985/05/23 [id number]  Musculoskeletal Traction Type of Traction: Gilberto Better Traction Traction Location: [facility]    Jennye Moccasin 08/13/2012, 4:57 PM
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
## 2012-08-13 NOTE — Anesthesia Preprocedure Evaluation (Addendum)
Anesthesia Evaluation  Patient identified by MRN, date of birth, ID band Patient awake    Reviewed: Allergy & Precautions, H&P , NPO status , Patient's Chart, lab work & pertinent test results  Airway Mallampati: I TM Distance: >3 FB Neck ROM: Full    Dental  (+) Teeth Intact   Pulmonary neg pulmonary ROS,          Cardiovascular Exercise Tolerance: Good + Valvular Problems/Murmurs  Apparently has a Bicuspid Aortic valve without much hemodynamic effect. Being worked up by a cardiologist in [place]   Neuro/Psych  Headaches,    GI/Hepatic negative GI ROS, Neg liver ROS,   Endo/Other  negative endocrine ROS  Renal/GU negative Renal ROS  negative genitourinary   Musculoskeletal   Abdominal   Peds  Hematology negative hematology ROS (+)   Anesthesia Other Findings   Reproductive/Obstetrics negative OB ROS                         Anesthesia Physical Anesthesia Plan  ASA: II and emergent  Anesthesia Plan: General ETT and General   Post-op Pain Management:    Induction: Intravenous, Rapid sequence and Cricoid pressure planned  Airway Management Planned: Oral ETT  Additional Equipment:   Intra-op Plan:   Post-operative Plan: Extubation in OR  Informed Consent: I have reviewed the patients History and Physical, chart, labs and discussed the procedure including the risks, benefits and alternatives for the proposed anesthesia with the patient or authorized representative who has indicated his/her understanding and acceptance.   Dental Advisory Given  Plan Discussed with: Surgeon and CRNA  Anesthesia Plan Comments:        Anesthesia Quick Evaluation
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
## 2012-08-13 NOTE — Progress Notes (Signed)
Bair hugger applied.

## 2012-08-13 NOTE — ED Notes (Signed)
Family updated as to patient's status.
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
## 2012-08-13 NOTE — ED Notes (Signed)
Family at beside. Family given emotional support. 
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
## 2012-08-13 NOTE — ED Notes (Signed)
Pt to CT at this time.
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
## 2012-08-13 NOTE — ED Notes (Signed)
Pt to x-ray then to OR.  Dr. Eula Fried will see pt in holding area.

## 2012-08-13 NOTE — Transfer of Care (Signed)
Immediate Anesthesia Transfer of Care Note  Patient: Theresa Lawrence  Procedure(s) Performed: Procedure(s): INTRAMEDULLARY (IM) NAIL FEMORAL- right (Right)  Patient Location: PACU  Anesthesia Type:General  Level of Consciousness: sedated, patient cooperative and responds to stimulation  Airway & Oxygen Therapy: Patient Spontanous Breathing and Patient connected to nasal cannula oxygen  Post-op Assessment: Report given to PACU RN, Post -op Vital signs reviewed and stable and Patient moving all extremities X 4  Post vital signs: Reviewed and stable  Complications: No apparent anesthesia complications

## 2012-08-14 ENCOUNTER — Inpatient Hospital Stay (HOSPITAL_COMMUNITY): Payer: BC Managed Care – PPO

## 2012-08-14 ENCOUNTER — Encounter (HOSPITAL_COMMUNITY): Payer: Self-pay | Admitting: *Deleted

## 2012-08-14 DIAGNOSIS — S0100XA Unspecified open wound of scalp, initial encounter: Secondary | ICD-10-CM

## 2012-08-14 DIAGNOSIS — I498 Other specified cardiac arrhythmias: Secondary | ICD-10-CM

## 2012-08-14 DIAGNOSIS — T300 Burn of unspecified body region, unspecified degree: Secondary | ICD-10-CM

## 2012-08-14 DIAGNOSIS — Q231 Congenital insufficiency of aortic valve: Secondary | ICD-10-CM

## 2012-08-14 DIAGNOSIS — G43909 Migraine, unspecified, not intractable, without status migrainosus: Secondary | ICD-10-CM

## 2012-08-14 DIAGNOSIS — R000 Tachycardia, unspecified: Secondary | ICD-10-CM | POA: Diagnosis present

## 2012-08-14 DIAGNOSIS — J45909 Unspecified asthma, uncomplicated: Secondary | ICD-10-CM | POA: Insufficient documentation

## 2012-08-14 DIAGNOSIS — S7290XA Unspecified fracture of unspecified femur, initial encounter for closed fracture: Secondary | ICD-10-CM

## 2012-08-14 LAB — BASIC METABOLIC PANEL
CO2: 21 mEq/L (ref 19–32)
Calcium: 8.2 mg/dL — ABNORMAL LOW (ref 8.4–10.5)
Creatinine, Ser: 0.67 mg/dL (ref 0.50–1.10)
GFR calc Af Amer: >90 mL/min (ref >90)
GFR calc non Af Amer: >90 mL/min (ref >90)
Sodium: 137 mEq/L (ref 135–145)

## 2012-08-14 LAB — CBC
MCH: 29.5 pg (ref 26.0–34.0)
MCHC: 34 g/dL (ref 30.0–36.0)
MCV: 87 fL (ref 78.0–100.0)
Platelets: 259 10*3/uL (ref 150–400)
RBC: 3.08 MIL/uL — ABNORMAL LOW (ref 3.87–5.11)
RDW: 13.2 % (ref 11.5–15.5)

## 2012-08-14 LAB — MRSA PCR SCREENING: MRSA by PCR: NEGATIVE

## 2012-08-14 LAB — POCT I-STAT 4, (NA,K, GLUC, HGB,HCT)
Glucose, Bld: 134 mg/dL — ABNORMAL HIGH (ref 70–99)
Potassium: 3.7 meq/L (ref 3.5–5.1)
Sodium: 139 meq/L (ref 135–145)

## 2012-08-14 MED ORDER — WHITE PETROLATUM GEL
Freq: Every day | Status: DC
  Filled 2012-08-14: qty 5

## 2012-08-14 MED ORDER — DEXTROSE 5 % IV SOLN
1000.0000 mg | Freq: Three times a day (TID) | INTRAVENOUS | Status: DC | PRN
  Administered 2012-08-14 – 2012-08-15 (×2): 1000 mg via INTRAVENOUS
  Filled 2012-08-14 (×3): qty 10

## 2012-08-14 MED ORDER — HYDROMORPHONE HCL PF 1 MG/ML IJ SOLN
1.0000 mg | INTRAMUSCULAR | Status: DC | PRN
  Administered 2012-08-14 – 2012-08-15 (×5): 1 mg via INTRAVENOUS
  Filled 2012-08-14 (×5): qty 1

## 2012-08-14 MED ORDER — OXYCODONE HCL 5 MG PO TABS
5.0000 mg | ORAL_TABLET | ORAL | Status: DC | PRN
  Administered 2012-08-14: 5 mg via ORAL
  Filled 2012-08-14: qty 1

## 2012-08-14 MED ORDER — ENOXAPARIN SODIUM 40 MG/0.4ML ~~LOC~~ SOLN
40.0000 mg | SUBCUTANEOUS | Status: DC
  Administered 2012-08-14 – 2012-08-19 (×6): 40 mg via SUBCUTANEOUS
  Filled 2012-08-14 (×6): qty 0.4

## 2012-08-14 MED ORDER — POTASSIUM CHLORIDE IN NACL 20-0.9 MEQ/L-% IV SOLN
INTRAVENOUS | Status: DC
  Administered 2012-08-14 – 2012-08-15 (×4): via INTRAVENOUS
  Administered 2012-08-15: 1000 mL via INTRAVENOUS
  Administered 2012-08-16 – 2012-08-17 (×2): via INTRAVENOUS
  Filled 2012-08-14 (×9): qty 1000

## 2012-08-14 MED ORDER — HYDROCODONE-ACETAMINOPHEN 5-325 MG PO TABS
1.0000 | ORAL_TABLET | ORAL | Status: DC | PRN
  Administered 2012-08-14: 2 via ORAL
  Filled 2012-08-14: qty 2

## 2012-08-14 MED ORDER — DOUBLE ANTIBIOTIC 500-10000 UNIT/GM EX OINT
TOPICAL_OINTMENT | Freq: Two times a day (BID) | CUTANEOUS | Status: DC
  Filled 2012-08-14 (×18): qty 1

## 2012-08-14 MED ORDER — HYDROMORPHONE HCL PF 1 MG/ML IJ SOLN
1.0000 mg | INTRAMUSCULAR | Status: DC | PRN
  Filled 2012-08-14 (×3): qty 1

## 2012-08-14 MED ORDER — HYDROMORPHONE HCL PF 1 MG/ML IJ SOLN
1.0000 mg | INTRAMUSCULAR | Status: DC | PRN
  Administered 2012-08-14 (×3): 1 mg via INTRAVENOUS

## 2012-08-14 MED ORDER — IOHEXOL 350 MG/ML SOLN
100.0000 mL | Freq: Once | INTRAVENOUS | Status: AC | PRN
  Administered 2012-08-14: 100 mL via INTRAVENOUS

## 2012-08-14 MED ORDER — TIZANIDINE HCL 4 MG PO TABS
8.0000 mg | ORAL_TABLET | Freq: Three times a day (TID) | ORAL | Status: DC | PRN
Start: 2012-08-14 — End: 2012-08-14
  Administered 2012-08-14: 8 mg via ORAL
  Filled 2012-08-14: qty 2

## 2012-08-14 MED ORDER — CHLORHEXIDINE GLUCONATE 0.12 % MT SOLN
15.0000 mL | Freq: Two times a day (BID) | OROMUCOSAL | Status: DC
  Administered 2012-08-14 – 2012-08-15 (×4): 15 mL via OROMUCOSAL
  Filled 2012-08-14 (×6): qty 15

## 2012-08-14 MED ORDER — BIOTENE DRY MOUTH MT LIQD
15.0000 mL | Freq: Two times a day (BID) | OROMUCOSAL | Status: DC
  Administered 2012-08-14 – 2012-08-16 (×4): 15 mL via OROMUCOSAL

## 2012-08-14 MED ORDER — ONDANSETRON HCL 4 MG/2ML IJ SOLN
4.0000 mg | Freq: Four times a day (QID) | INTRAMUSCULAR | Status: DC | PRN
  Administered 2012-08-14 – 2012-08-15 (×7): 4 mg via INTRAVENOUS
  Filled 2012-08-14 (×7): qty 2

## 2012-08-14 MED ORDER — SODIUM CHLORIDE 0.9 % IV BOLUS (SEPSIS)
1000.0000 mL | Freq: Once | INTRAVENOUS | Status: AC
  Administered 2012-08-14: 1000 mL via INTRAVENOUS

## 2012-08-14 MED ORDER — DOUBLE ANTIBIOTIC 500-10000 UNIT/GM EX OINT
TOPICAL_OINTMENT | Freq: Two times a day (BID) | CUTANEOUS | Status: DC
  Administered 2012-08-14 – 2012-08-19 (×11): via TOPICAL
  Filled 2012-08-14: qty 14.17

## 2012-08-14 MED ORDER — PANTOPRAZOLE SODIUM 40 MG IV SOLR
40.0000 mg | Freq: Every day | INTRAVENOUS | Status: DC
  Filled 2012-08-14 (×3): qty 40

## 2012-08-14 MED ORDER — HYDROMORPHONE HCL PF 1 MG/ML IJ SOLN: 1.0000 mg | INTRAMUSCULAR | Status: DC | PRN

## 2012-08-14 MED ORDER — PANTOPRAZOLE SODIUM 40 MG PO TBEC
40.0000 mg | DELAYED_RELEASE_TABLET | Freq: Every day | ORAL | Status: DC
  Administered 2012-08-14 – 2012-08-19 (×6): 40 mg via ORAL
  Filled 2012-08-14 (×4): qty 1

## 2012-08-14 MED ORDER — SILVER SULFADIAZINE 1 % EX CREA
TOPICAL_CREAM | Freq: Every day | CUTANEOUS | Status: DC
  Administered 2012-08-14 – 2012-08-19 (×6): via TOPICAL
  Filled 2012-08-14 (×3): qty 85

## 2012-08-14 MED ORDER — CEFAZOLIN SODIUM-DEXTROSE 2-3 GM-% IV SOLR
2.0000 g | Freq: Four times a day (QID) | INTRAVENOUS | Status: AC
  Administered 2012-08-14 (×2): 2 g via INTRAVENOUS
  Filled 2012-08-14 (×2): qty 50

## 2012-08-14 MED ORDER — HYDROMORPHONE HCL PF 1 MG/ML IJ SOLN
1.0000 mg | INTRAMUSCULAR | Status: DC | PRN
  Administered 2012-08-14: 1 mg via INTRAVENOUS
  Filled 2012-08-14: qty 1

## 2012-08-14 MED ORDER — ONDANSETRON HCL 4 MG PO TABS
4.0000 mg | ORAL_TABLET | Freq: Four times a day (QID) | ORAL | Status: DC | PRN
  Filled 2012-08-14: qty 1

## 2012-08-14 MED FILL — Sodium Chloride Soln Nebu 0.9%: RESPIRATORY_TRACT | Qty: 15 | Status: AC

## 2012-08-14 NOTE — Progress Notes (Signed)
Patient ID: Theresa Lawrence, female   DOB: 1985-07-28, 27 y.o.   MRN: [id number] Was notified by RN that patient had ongoing muscle spasms. She has Zanaflex ordered, however, she has had some nausea and vomiting today. We'll change her to clear liquid diet and increase IVF. We'll DC Zanaflex and order Robaxin IV. [name], MD, MPH, FACS Pager: [phone]
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
## 2012-08-14 NOTE — Progress Notes (Signed)
UR completed 
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
## 2012-08-14 NOTE — Anesthesia Postprocedure Evaluation (Signed)
Anesthesia Post Note  Patient: Theresa Lawrence  Procedure(s) Performed: Procedure(s) (LRB): INTRAMEDULLARY (IM) NAIL FEMORAL- right (Right)  Anesthesia type: general  Patient location: PACU  Post pain: Pain level controlled  Post assessment: Patient's Cardiovascular Status Stable  Last Vitals:  Filed Vitals:   08/14/12 0000  BP: 104/77  Pulse: 93  Temp: 36.3 C  Resp: 11    Post vital signs: Reviewed and stable  Level of consciousness: sedated  Complications: No apparent anesthesia complications

## 2012-08-14 NOTE — Progress Notes (Signed)
Patient ID: Theresa Lawrence, female   DOB: Sep 23, 1985, 27 y.o.   MRN: [id number] I met with patient's parents and others at the bedside and answered their questions and discussed the plan of care.  [name], MD, MPH, FACS Pager: [phone]
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
## 2012-08-14 NOTE — Progress Notes (Signed)
   Subjective: 1 Day Post-Op Procedure(s) (LRB): INTRAMEDULLARY (IM) NAIL FEMORAL- right (Right)   Patient reports pain as moderate, states that she is having muscle spasms in the right leg which are causing the worst pain. Other than some muscle spasms and pain she says that she is doing good.  Objective:   VITALS:   Filed Vitals:   08/14/12   BP: 107/68  Pulse: 116  Temp: 98.2 F (36.8 C)   Resp: 14    Neurovascular intact Dorsiflexion/Plantar flexion intact Incision: dressing C/D/I No cellulitis present Compartment soft  LABS  Recent Labs  08/11/12 1725 08/13/12 1711 08/13/12 1736 08/14/12 0830  HGB 12.0 11.3* 11.9* 9.1*  HCT 35.6* 33.8* 35.0* 26.8*  WBC 6.2 10.0  --  9.8  PLT 236 251  --  259     Recent Labs  08/13/12 1711 08/13/12 1736 08/14/12 0830  NA 138 140 137  K 4.0 4.7 4.2  BUN 16 19 10   CREATININE 0.81 0.90 0.67  GLUCOSE 125* 121* 132*     Assessment/Plan: 1 Day Post-Op Procedure(s) (LRB): INTRAMEDULLARY (IM) NAIL FEMORAL- right (Right) Advance diet as tolerated, she is feeling sick with medications on an empty stomach Up with therapy, 50% WB on the right leg On Lovenox for anticoagulation, per Dr. Charlann Boxer would be good with ASA 325mg  bid for home use if trauma team is good with this.     Anastasio Auerbach Aren Pryde   PAC  08/14/2012, 12:47 PM
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
## 2012-08-14 NOTE — Progress Notes (Addendum)
  1112:  Karen from infection prevention contacted nurse and informed nurse that the decision had been made to place pt on contact for MRSA due to strong history.  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  Nurse contacted Clydie Braun with infection prevention and was informed that since last occurrence happened greater than one year prior, contact precautions should not be initiated.

## 2012-08-14 NOTE — Progress Notes (Signed)
Pt was transported to CT alert and oriented without questions or concerns.  Pt was transported on room air with IVF infusing
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
## 2012-08-14 NOTE — Progress Notes (Signed)
Nurse informed MD Short that pt periodically sustains HR 120-139 (asymptomatic).  MD acknowledged nurse concerns and will order NS bolus for increased heart rate.  Nurse will administer bolus as ordered and will continue to monitor.

## 2012-08-14 NOTE — Progress Notes (Signed)
Echocardiogram 2D Echocardiogram has been performed.  Rashaad Hallstrom 08/14/2012, 3:11 PM

## 2012-08-14 NOTE — Plan of Care (Signed)
Problem: Phase I Progression Outcomes Goal: Pain controlled with appropriate interventions Outcome: Progressing Pt complained of ongoing pain.  MD at bedside and will order muscle spasm medication administration as needed in addition to current pain medication

## 2012-08-15 ENCOUNTER — Encounter (HOSPITAL_COMMUNITY): Payer: Self-pay | Admitting: Orthopedic Surgery

## 2012-08-15 DIAGNOSIS — T24001A Burn of unspecified degree of unspecified site of right lower limb, except ankle and foot, initial encounter: Secondary | ICD-10-CM

## 2012-08-15 DIAGNOSIS — D62 Acute posthemorrhagic anemia: Secondary | ICD-10-CM

## 2012-08-15 DIAGNOSIS — R55 Syncope and collapse: Secondary | ICD-10-CM | POA: Diagnosis present

## 2012-08-15 DIAGNOSIS — T2000XA Burn of unspecified degree of head, face, and neck, unspecified site, initial encounter: Secondary | ICD-10-CM

## 2012-08-15 DIAGNOSIS — S52202A Unspecified fracture of shaft of left ulna, initial encounter for closed fracture: Secondary | ICD-10-CM

## 2012-08-15 DIAGNOSIS — S0101XA Laceration without foreign body of scalp, initial encounter: Secondary | ICD-10-CM

## 2012-08-15 DIAGNOSIS — S52209A Unspecified fracture of shaft of unspecified ulna, initial encounter for closed fracture: Secondary | ICD-10-CM

## 2012-08-15 DIAGNOSIS — S72309A Unspecified fracture of shaft of unspecified femur, initial encounter for closed fracture: Secondary | ICD-10-CM

## 2012-08-15 DIAGNOSIS — T25021A Burn of unspecified degree of right foot, initial encounter: Secondary | ICD-10-CM

## 2012-08-15 LAB — CBC
MCHC: 32.8 g/dL (ref 30.0–36.0)
RDW: 13.6 % (ref 11.5–15.5)

## 2012-08-15 LAB — URINE DRUGS OF ABUSE SCREEN W ALC, ROUTINE (REF LAB)
Barbiturate Quant, Ur: NEGATIVE
Benzodiazepines.: POSITIVE — AB
Creatinine,U: 137.3 mg/dL
Ethyl Alcohol: <10 mg/dL (ref <10)
Marijuana Metabolite: NEGATIVE

## 2012-08-15 LAB — BASIC METABOLIC PANEL
GFR calc Af Amer: >90 mL/min (ref >90)
GFR calc non Af Amer: >90 mL/min (ref >90)
Potassium: 3.8 mEq/L (ref 3.5–5.1)
Sodium: 137 mEq/L (ref 135–145)

## 2012-08-15 LAB — T3, FREE: T3, Free: 2.2 pg/mL — ABNORMAL LOW (ref 2.3–4.2)

## 2012-08-15 LAB — T4, FREE: Free T4: 1.07 ng/dL (ref 0.80–1.80)

## 2012-08-15 MED ORDER — IMIPRAMINE HCL 50 MG PO TABS
50.0000 mg | ORAL_TABLET | Freq: Every day | ORAL | Status: DC
  Administered 2012-08-15 – 2012-08-18 (×4): 50 mg via ORAL
  Filled 2012-08-15 (×7): qty 1

## 2012-08-15 MED ORDER — HYDROMORPHONE HCL PF 1 MG/ML IJ SOLN
1.0000 mg | INTRAMUSCULAR | Status: DC | PRN
  Administered 2012-08-15: 1 mg via INTRAVENOUS

## 2012-08-15 MED ORDER — DIPHENHYDRAMINE HCL 50 MG/ML IJ SOLN
12.5000 mg | Freq: Four times a day (QID) | INTRAMUSCULAR | Status: DC | PRN
  Administered 2012-08-15: 12.5 mg via INTRAVENOUS
  Filled 2012-08-15: qty 1

## 2012-08-15 MED ORDER — DIPHENHYDRAMINE HCL 12.5 MG/5ML PO ELIX: 12.5000 mg | ORAL_SOLUTION | Freq: Four times a day (QID) | ORAL | Status: DC | PRN

## 2012-08-15 MED ORDER — NALOXONE HCL 0.4 MG/ML IJ SOLN: 0.4000 mg | INTRAMUSCULAR | Status: DC | PRN

## 2012-08-15 MED ORDER — HYDROMORPHONE HCL PF 1 MG/ML IJ SOLN: 1.0000 mg | INTRAMUSCULAR | Status: DC | PRN

## 2012-08-15 MED ORDER — METHOCARBAMOL 100 MG/ML IJ SOLN
1000.0000 mg | Freq: Four times a day (QID) | INTRAVENOUS | Status: DC | PRN
  Administered 2012-08-15 – 2012-08-18 (×4): 1000 mg via INTRAVENOUS
  Filled 2012-08-15 (×4): qty 10

## 2012-08-15 MED ORDER — HYDROCODONE-ACETAMINOPHEN 10-325 MG PO TABS
0.5000 | ORAL_TABLET | ORAL | Status: DC | PRN
  Administered 2012-08-15 – 2012-08-16 (×6): 1 via ORAL
  Administered 2012-08-17: 2 via ORAL
  Administered 2012-08-17 – 2012-08-19 (×7): 1 via ORAL
  Filled 2012-08-15 (×8): qty 1
  Filled 2012-08-15: qty 2
  Filled 2012-08-15 (×2): qty 1
  Filled 2012-08-15: qty 2
  Filled 2012-08-15 (×2): qty 1

## 2012-08-15 MED ORDER — HYDROMORPHONE 0.3 MG/ML IV SOLN: INTRAVENOUS | Status: DC

## 2012-08-15 MED ORDER — METHOCARBAMOL 500 MG PO TABS
1000.0000 mg | ORAL_TABLET | Freq: Four times a day (QID) | ORAL | Status: DC | PRN
  Administered 2012-08-16: 1000 mg via ORAL
  Filled 2012-08-15 (×2): qty 2

## 2012-08-15 MED ORDER — SODIUM CHLORIDE 0.9 % IJ SOLN: 9.0000 mL | INTRAMUSCULAR | Status: DC | PRN

## 2012-08-15 MED ORDER — ALBUTEROL SULFATE HFA 108 (90 BASE) MCG/ACT IN AERS
2.0000 | INHALATION_SPRAY | RESPIRATORY_TRACT | Status: DC | PRN
  Filled 2012-08-15: qty 6.7

## 2012-08-15 MED ORDER — ONDANSETRON HCL 4 MG/2ML IJ SOLN: 4.0000 mg | Freq: Four times a day (QID) | INTRAMUSCULAR | Status: DC | PRN

## 2012-08-15 NOTE — Plan of Care (Signed)
Problem: Phase I Progression Outcomes Goal: Voiding-avoid urinary catheter unless indicated Outcome: Completed/Met Date Met:  08/15/12 Foley removed per order
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
## 2012-08-15 NOTE — Progress Notes (Signed)
Nurse called report to [facility] on 6N at approximately 1300.  Nurse communicated that foley was removed at approximately 0930, and pt has not voided since. Pt was transported to unit; alert and oriented, without questions, IVF infusing, on room air.  Pt family was at bedside during transport and all pt belongings were carried to new room.
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
## 2012-08-15 NOTE — Progress Notes (Signed)
Rehab Admissions Coordinator Note:  Patient was screened by Brock Ra for appropriateness for an Inpatient Acute Rehab Consult.  At this time, we are recommending Inpatient Rehab consult.  Brock Ra 08/15/2012, 8:15 AM  I can be reached at [phone].
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
## 2012-08-16 LAB — CBC
HCT: 21.3 % — ABNORMAL LOW (ref 36.0–46.0)
Hemoglobin: 7.1 g/dL — ABNORMAL LOW (ref 12.0–15.0)
MCH: 29.3 pg (ref 26.0–34.0)
MCHC: 33.3 g/dL (ref 30.0–36.0)
RBC: 2.42 MIL/uL — ABNORMAL LOW (ref 3.87–5.11)

## 2012-08-16 LAB — HEMOGLOBIN AND HEMATOCRIT, BLOOD: HCT: 29.1 % — ABNORMAL LOW (ref 36.0–46.0)

## 2012-08-16 LAB — ABO/RH: ABO/RH(D): O POS

## 2012-08-16 LAB — PREPARE RBC (CROSSMATCH)
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
## 2012-08-17 LAB — TYPE AND SCREEN
ABO/RH(D): O POS
Unit division: 0
Unit division: 0

## 2012-08-17 MED ORDER — DIPHENHYDRAMINE HCL 25 MG PO CAPS
50.0000 mg | ORAL_CAPSULE | Freq: Four times a day (QID) | ORAL | Status: DC | PRN
  Administered 2012-08-17 – 2012-08-18 (×2): 50 mg via ORAL
  Filled 2012-08-17 (×2): qty 2
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
## 2012-08-18 MED ORDER — POLYETHYLENE GLYCOL 3350 17 G PO PACK
17.0000 g | PACK | Freq: Every day | ORAL | Status: DC
  Administered 2012-08-18 – 2012-08-19 (×2): 17 g via ORAL
  Filled 2012-08-18: qty 1

## 2012-08-18 MED ORDER — BACLOFEN 1 MG/ML ORAL SUSPENSION
10.0000 mg | Freq: Three times a day (TID) | ORAL | Status: DC
  Filled 2012-08-18 (×3): qty 1

## 2012-08-18 MED ORDER — BACLOFEN 1 MG/ML ORAL SUSPENSION
10.0000 mg | Freq: Three times a day (TID) | ORAL | Status: DC
  Administered 2012-08-18 – 2012-08-19 (×4): 10 mg via ORAL
  Filled 2012-08-18 (×6): qty 1
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
## 2012-08-18 NOTE — Progress Notes (Signed)
Rehab admissions - Evaluated for possible admission.  Noted patient is refusing inpatient rehab and wishes to discharge home rather than admit to inpatient rehab.  She is doing well with therapies.  I will sign off for inpatient rehab.  Call me for questions.  #[phone]
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
## 2012-08-19 MED ORDER — HYDROCODONE-ACETAMINOPHEN 5-325 MG PO TABS: 1.0000 | ORAL_TABLET | ORAL | Status: DC | PRN

## 2012-08-19 MED ORDER — SILVER SULFADIAZINE 1 % EX CREA: TOPICAL_CREAM | Freq: Every day | CUTANEOUS | Status: DC

## 2012-08-19 MED ORDER — ONDANSETRON HCL 4 MG PO TABS: 4.0000 mg | ORAL_TABLET | Freq: Four times a day (QID) | ORAL | Status: DC | PRN

## 2012-08-19 MED ORDER — TIZANIDINE HCL 4 MG PO CAPS: 4.0000 mg | ORAL_CAPSULE | Freq: Three times a day (TID) | ORAL | Status: DC | PRN
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
## 2012-08-19 NOTE — Discharge Summary (Signed)
Dequarius Jeffries, MD, MPH, FACS Pager: [phone]  

## 2012-08-19 NOTE — Progress Notes (Signed)
Delivered wheelchair, rolling walker with left platform attachment, tub transfer bench and 3in1 commode to the room.   Theresa Lawrence [phone]
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
## 2012-08-19 NOTE — Progress Notes (Signed)
Patient discharged to home with instructions and with her equipments.
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
## 2012-08-19 NOTE — Progress Notes (Signed)
Agree Shakerria Parran, MD, MPH, FACS Pager: [phone]  

## 2012-08-19 NOTE — Progress Notes (Signed)
Pt to d/c home today. Agency choices presented to pt from her insurance network and she chose [facility].  Address and phone numbers in EPIC are confirmed as correct.

## 2012-08-20 LAB — BENZODIAZEPINE, QUANTITATIVE, URINE
Alprazolam metabolite (GC/LC/MS), ur confirm: NEGATIVE ng/mL
Diazepam (GC/LC/MS), ur confirm: NEGATIVE ng/mL
Flunitrazepam metabolite (GC/LC/MS), ur confirm: NEGATIVE ng/mL
Midazolam (GC/LC/MS), ur confirm: 1077 ng/mL
Oxazepam GC/MS Conf: NEGATIVE ng/mL
Temazepam GC/MS Conf: NEGATIVE ng/mL

## 2012-08-29 ENCOUNTER — Encounter (INDEPENDENT_AMBULATORY_CARE_PROVIDER_SITE_OTHER): Payer: Self-pay

## 2012-08-29 ENCOUNTER — Ambulatory Visit (INDEPENDENT_AMBULATORY_CARE_PROVIDER_SITE_OTHER): Payer: BC Managed Care – PPO | Admitting: Internal Medicine

## 2012-08-29 DIAGNOSIS — T24299A Burn of second degree of multiple sites of unspecified lower limb, except ankle and foot, initial encounter: Secondary | ICD-10-CM
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
## 2012-09-01 ENCOUNTER — Telehealth: Payer: Self-pay | Admitting: *Deleted

## 2012-09-01 NOTE — Telephone Encounter (Signed)
Patient's previous PUS was never rescheduled.  After chart review with Dr Farrel Gobble, SAB noted so no further PUS needed.  Phone call to patient for support, LMTCB.

## 2012-09-12 ENCOUNTER — Ambulatory Visit (INDEPENDENT_AMBULATORY_CARE_PROVIDER_SITE_OTHER): Payer: BC Managed Care – PPO | Admitting: Internal Medicine

## 2012-09-12 ENCOUNTER — Other Ambulatory Visit (INDEPENDENT_AMBULATORY_CARE_PROVIDER_SITE_OTHER): Payer: Self-pay | Admitting: Internal Medicine

## 2012-09-12 ENCOUNTER — Encounter (INDEPENDENT_AMBULATORY_CARE_PROVIDER_SITE_OTHER): Payer: Self-pay | Admitting: Internal Medicine

## 2012-09-12 DIAGNOSIS — T2124XA Burn of second degree of lower back, initial encounter: Secondary | ICD-10-CM

## 2012-09-12 DIAGNOSIS — T24299A Burn of second degree of multiple sites of unspecified lower limb, except ankle and foot, initial encounter: Secondary | ICD-10-CM

## 2012-09-12 DIAGNOSIS — S7292XD Unspecified fracture of left femur, subsequent encounter for closed fracture with routine healing: Secondary | ICD-10-CM

## 2012-09-12 DIAGNOSIS — T2020XD Burn of second degree of head, face, and neck, unspecified site, subsequent encounter: Secondary | ICD-10-CM

## 2012-09-12 LAB — CBC
HCT: 35.9 % — ABNORMAL LOW (ref 36.0–46.0)
Hemoglobin: 11.8 g/dL — ABNORMAL LOW (ref 12.0–15.0)
MCH: 28.3 pg (ref 26.0–34.0)
MCHC: 32.9 g/dL (ref 30.0–36.0)
MCV: 86.1 fL (ref 78.0–100.0)
Platelets: 343 10*3/uL (ref 150–400)
RBC: 4.17 MIL/uL (ref 3.87–5.11)
RDW: 14.4 % (ref 11.5–15.5)
WBC: 8 10*3/uL (ref 4.0–10.5)

## 2012-09-12 MED ORDER — HYDROXYZINE PAMOATE 25 MG PO CAPS: 25.0000 mg | ORAL_CAPSULE | Freq: Three times a day (TID) | ORAL | Status: DC | PRN
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
## 2012-09-12 NOTE — Patient Instructions (Addendum)
Continue dressing changes F/u 2 weeks Vistaril for icthing

## 2012-09-22 ENCOUNTER — Other Ambulatory Visit (INDEPENDENT_AMBULATORY_CARE_PROVIDER_SITE_OTHER): Payer: Self-pay | Admitting: *Deleted

## 2012-09-22 ENCOUNTER — Telehealth (INDEPENDENT_AMBULATORY_CARE_PROVIDER_SITE_OTHER): Payer: Self-pay | Admitting: *Deleted

## 2012-09-22 MED ORDER — HYDROXYZINE PAMOATE 25 MG PO CAPS: 25.0000 mg | ORAL_CAPSULE | Freq: Three times a day (TID) | ORAL | Status: DC | PRN

## 2012-09-22 NOTE — Telephone Encounter (Signed)
Lori with [facility] called to ask for a refill of Vistaril for the patient.  Patient reports continued difficulty with full body itching with Benadryl not helping.  Spoke to [employer] PA who approved refill of medication at this time.  Lawson Fiscal updated at the pharmacy.

## 2012-10-03 ENCOUNTER — Ambulatory Visit (INDEPENDENT_AMBULATORY_CARE_PROVIDER_SITE_OTHER): Payer: BC Managed Care – PPO | Admitting: Internal Medicine

## 2012-10-03 ENCOUNTER — Encounter (INDEPENDENT_AMBULATORY_CARE_PROVIDER_SITE_OTHER): Payer: Self-pay | Admitting: Internal Medicine

## 2012-10-03 DIAGNOSIS — T24299A Burn of second degree of multiple sites of unspecified lower limb, except ankle and foot, initial encounter: Secondary | ICD-10-CM

## 2012-10-03 DIAGNOSIS — T25121D Burn of first degree of right foot, subsequent encounter: Secondary | ICD-10-CM

## 2012-10-03 DIAGNOSIS — T2124XA Burn of second degree of lower back, initial encounter: Secondary | ICD-10-CM

## 2012-10-03 DIAGNOSIS — T24201D Burn of second degree of unspecified site of right lower limb, except ankle and foot, subsequent encounter: Secondary | ICD-10-CM

## 2012-10-03 MED ORDER — HYDROXYZINE PAMOATE 25 MG PO CAPS: 25.0000 mg | ORAL_CAPSULE | Freq: Three times a day (TID) | ORAL | Status: DC | PRN

## 2012-10-03 NOTE — Patient Instructions (Signed)
Continue SSD on right knee May leave other areas open to air Plan follow up as needed Call with questions or concerns
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
## 2013-01-15 ENCOUNTER — Encounter (HOSPITAL_COMMUNITY): Payer: Self-pay | Admitting: *Deleted

## 2013-01-15 NOTE — Progress Notes (Signed)
Need orders in EPIC. Surgery scheduled for 01/22/2013. Thank You.

## 2013-01-22 ENCOUNTER — Inpatient Hospital Stay (HOSPITAL_COMMUNITY)
Admission: RE | Admit: 2013-01-22 | Discharge: 2013-01-24 | DRG: 481 | Disposition: A | Discharge status: Home / Self Care | Payer: BC Managed Care – PPO | Source: Ambulatory Visit | Attending: Orthopedic Surgery | Admitting: Orthopedic Surgery

## 2013-01-22 ENCOUNTER — Inpatient Hospital Stay (HOSPITAL_COMMUNITY): Payer: BC Managed Care – PPO

## 2013-01-22 ENCOUNTER — Encounter (HOSPITAL_COMMUNITY): Payer: Self-pay | Admitting: *Deleted

## 2013-01-22 ENCOUNTER — Encounter (HOSPITAL_COMMUNITY)
Admission: RE | Disposition: A | Discharge status: Home / Self Care | Payer: Self-pay | Source: Ambulatory Visit | Attending: Orthopedic Surgery

## 2013-01-22 ENCOUNTER — Inpatient Hospital Stay (HOSPITAL_COMMUNITY): Payer: BC Managed Care – PPO | Admitting: Certified Registered"

## 2013-01-22 ENCOUNTER — Encounter (HOSPITAL_COMMUNITY): Payer: BC Managed Care – PPO | Admitting: Certified Registered"

## 2013-01-22 DIAGNOSIS — J45909 Unspecified asthma, uncomplicated: Secondary | ICD-10-CM | POA: Diagnosis present

## 2013-01-22 DIAGNOSIS — K219 Gastro-esophageal reflux disease without esophagitis: Secondary | ICD-10-CM | POA: Diagnosis present

## 2013-01-22 DIAGNOSIS — G43909 Migraine, unspecified, not intractable, without status migrainosus: Secondary | ICD-10-CM | POA: Diagnosis present

## 2013-01-22 DIAGNOSIS — Q231 Congenital insufficiency of aortic valve: Secondary | ICD-10-CM

## 2013-01-22 DIAGNOSIS — D62 Acute posthemorrhagic anemia: Secondary | ICD-10-CM | POA: Diagnosis not present

## 2013-01-22 DIAGNOSIS — Z79899 Other long term (current) drug therapy: Secondary | ICD-10-CM

## 2013-01-22 DIAGNOSIS — S7291XK Unspecified fracture of right femur, subsequent encounter for closed fracture with nonunion: Secondary | ICD-10-CM

## 2013-01-22 DIAGNOSIS — IMO0002 Reserved for concepts with insufficient information to code with codable children: Principal | ICD-10-CM | POA: Diagnosis present

## 2013-01-22 DIAGNOSIS — I959 Hypotension, unspecified: Secondary | ICD-10-CM | POA: Diagnosis not present

## 2013-01-22 HISTORY — DX: Gastro-esophageal reflux disease without esophagitis: K21.9

## 2013-01-22 HISTORY — DX: Other specified postprocedural states: Z98.890

## 2013-01-22 HISTORY — DX: Anemia, unspecified: D64.9

## 2013-01-22 HISTORY — PX: FEMUR IM NAIL: SHX1597

## 2013-01-22 HISTORY — DX: Nausea with vomiting, unspecified: R11.2

## 2013-01-22 LAB — BASIC METABOLIC PANEL
BUN: 10 mg/dL (ref 6–23)
CO2: 26 mEq/L (ref 19–32)
Calcium: 9.4 mg/dL (ref 8.4–10.5)
Glucose, Bld: 90 mg/dL (ref 70–99)
Potassium: 3.7 mEq/L (ref 3.5–5.1)
Sodium: 138 mEq/L (ref 135–145)

## 2013-01-22 LAB — TYPE AND SCREEN
ABO/RH(D): O POS
Antibody Screen: NEGATIVE

## 2013-01-22 LAB — URINALYSIS, ROUTINE W REFLEX MICROSCOPIC
Bilirubin Urine: NEGATIVE
Glucose, UA: NEGATIVE mg/dL
Nitrite: NEGATIVE
Protein, ur: NEGATIVE mg/dL
Specific Gravity, Urine: 1.01 (ref 1.005–1.030)
Urobilinogen, UA: 0.2 mg/dL (ref 0.0–1.0)

## 2013-01-22 LAB — URINE MICROSCOPIC-ADD ON

## 2013-01-22 LAB — CBC
HCT: 36.7 % (ref 36.0–46.0)
Hemoglobin: 12.2 g/dL (ref 12.0–15.0)
MCH: 28.8 pg (ref 26.0–34.0)
MCV: 86.8 fL (ref 78.0–100.0)
RBC: 4.23 MIL/uL (ref 3.87–5.11)
RDW: 13 % (ref 11.5–15.5)

## 2013-01-22 LAB — PROTIME-INR: INR: 0.98 (ref 0.00–1.49)

## 2013-01-22 LAB — APTT: aPTT: 32 seconds (ref 24–37)

## 2013-01-22 LAB — ABO/RH: ABO/RH(D): O POS

## 2013-01-22 SURGERY — INSERTION, INTRAMEDULLARY ROD, FEMUR
Anesthesia: General | Laterality: Right | Wound class: Clean

## 2013-01-22 MED ORDER — LACTATED RINGERS IV SOLN: INTRAVENOUS | Status: DC

## 2013-01-22 MED ORDER — SUMATRIPTAN SUCCINATE 100 MG PO TABS: 100.0000 mg | ORAL_TABLET | ORAL | Status: DC | PRN

## 2013-01-22 MED ORDER — MIDAZOLAM HCL 5 MG/5ML IJ SOLN
INTRAMUSCULAR | Status: DC | PRN
  Administered 2013-01-22: 2 mg via INTRAVENOUS

## 2013-01-22 MED ORDER — PANTOPRAZOLE SODIUM 40 MG PO TBEC
40.0000 mg | DELAYED_RELEASE_TABLET | Freq: Every day | ORAL | Status: DC
  Administered 2013-01-23 – 2013-01-24 (×2): 40 mg via ORAL
  Filled 2013-01-22 (×2): qty 1

## 2013-01-22 MED ORDER — ACETAMINOPHEN 325 MG PO TABS: 650.0000 mg | ORAL_TABLET | Freq: Four times a day (QID) | ORAL | Status: DC | PRN

## 2013-01-22 MED ORDER — HYDROMORPHONE HCL PF 1 MG/ML IJ SOLN
INTRAMUSCULAR | Status: AC
  Filled 2013-01-22: qty 1

## 2013-01-22 MED ORDER — EPHEDRINE SULFATE 50 MG/ML IJ SOLN
INTRAMUSCULAR | Status: DC | PRN
  Administered 2013-01-22 (×2): 5 mg via INTRAVENOUS

## 2013-01-22 MED ORDER — CEFAZOLIN SODIUM-DEXTROSE 2-3 GM-% IV SOLR
2.0000 g | INTRAVENOUS | Status: AC
  Administered 2013-01-22: 2 g via INTRAVENOUS

## 2013-01-22 MED ORDER — ALBUTEROL SULFATE HFA 108 (90 BASE) MCG/ACT IN AERS
2.0000 | INHALATION_SPRAY | RESPIRATORY_TRACT | Status: DC | PRN
  Filled 2013-01-22: qty 6.7

## 2013-01-22 MED ORDER — TIZANIDINE HCL 4 MG PO CAPS
4.0000 mg | ORAL_CAPSULE | Freq: Three times a day (TID) | ORAL | Status: DC | PRN
  Administered 2013-01-22: 4 mg via ORAL
  Filled 2013-01-22 (×2): qty 1

## 2013-01-22 MED ORDER — PROMETHAZINE HCL 25 MG/ML IJ SOLN: 6.2500 mg | INTRAMUSCULAR | Status: DC | PRN

## 2013-01-22 MED ORDER — 0.9 % SODIUM CHLORIDE (POUR BTL) OPTIME
TOPICAL | Status: DC | PRN
  Administered 2013-01-22: 1000 mL

## 2013-01-22 MED ORDER — HYDROMORPHONE HCL PF 1 MG/ML IJ SOLN
0.5000 mg | INTRAMUSCULAR | Status: DC | PRN
  Administered 2013-01-22 – 2013-01-23 (×6): 1 mg via INTRAVENOUS
  Filled 2013-01-22 (×6): qty 1

## 2013-01-22 MED ORDER — DIPHENHYDRAMINE HCL 25 MG PO CAPS
25.0000 mg | ORAL_CAPSULE | Freq: Four times a day (QID) | ORAL | Status: DC | PRN
  Administered 2013-01-22: 25 mg via ORAL
  Filled 2013-01-22: qty 1

## 2013-01-22 MED ORDER — SCOPOLAMINE 1 MG/3DAYS TD PT72
MEDICATED_PATCH | TRANSDERMAL | Status: DC | PRN
  Administered 2013-01-22: 1 via TRANSDERMAL

## 2013-01-22 MED ORDER — HYDROCODONE-ACETAMINOPHEN 5-325 MG PO TABS
1.0000 | ORAL_TABLET | Freq: Four times a day (QID) | ORAL | Status: DC | PRN
  Administered 2013-01-22 – 2013-01-23 (×2): 2 via ORAL
  Filled 2013-01-22 (×2): qty 2

## 2013-01-22 MED ORDER — ONDANSETRON HCL 4 MG/2ML IJ SOLN
INTRAMUSCULAR | Status: DC | PRN
  Administered 2013-01-22: 4 mg via INTRAVENOUS

## 2013-01-22 MED ORDER — ASPIRIN EC 325 MG PO TBEC
325.0000 mg | DELAYED_RELEASE_TABLET | Freq: Every day | ORAL | Status: DC
  Administered 2013-01-23 – 2013-01-24 (×2): 325 mg via ORAL
  Filled 2013-01-22 (×3): qty 1

## 2013-01-22 MED ORDER — HYDROMORPHONE HCL PF 1 MG/ML IJ SOLN: 0.2500 mg | INTRAMUSCULAR | Status: DC | PRN

## 2013-01-22 MED ORDER — ACETAMINOPHEN 650 MG RE SUPP: 650.0000 mg | Freq: Four times a day (QID) | RECTAL | Status: DC | PRN

## 2013-01-22 MED ORDER — CEFAZOLIN SODIUM-DEXTROSE 2-3 GM-% IV SOLR
INTRAVENOUS | Status: AC
  Filled 2013-01-22: qty 50

## 2013-01-22 MED ORDER — POLYETHYLENE GLYCOL 3350 17 G PO PACK: 17.0000 g | PACK | Freq: Every day | ORAL | Status: DC | PRN

## 2013-01-22 MED ORDER — METOCLOPRAMIDE HCL 10 MG PO TABS: 5.0000 mg | ORAL_TABLET | Freq: Three times a day (TID) | ORAL | Status: DC | PRN

## 2013-01-22 MED ORDER — CHLORHEXIDINE GLUCONATE 4 % EX LIQD: 60.0000 mL | Freq: Once | CUTANEOUS | Status: DC

## 2013-01-22 MED ORDER — METOCLOPRAMIDE HCL 10 MG PO TABS: 10.0000 mg | ORAL_TABLET | Freq: Four times a day (QID) | ORAL | Status: DC | PRN

## 2013-01-22 MED ORDER — LACTATED RINGERS IV SOLN
INTRAVENOUS | Status: DC | PRN
  Administered 2013-01-22: 11:00:00 via INTRAVENOUS

## 2013-01-22 MED ORDER — IMIPRAMINE HCL 50 MG PO TABS
100.0000 mg | ORAL_TABLET | Freq: Every day | ORAL | Status: DC
  Administered 2013-01-22 – 2013-01-23 (×2): 100 mg via ORAL
  Filled 2013-01-22 (×3): qty 2

## 2013-01-22 MED ORDER — LIDOCAINE HCL (CARDIAC) 20 MG/ML IV SOLN
INTRAVENOUS | Status: DC | PRN
  Administered 2013-01-22: 40 mg via INTRAVENOUS

## 2013-01-22 MED ORDER — PROPOFOL 10 MG/ML IV BOLUS
INTRAVENOUS | Status: DC | PRN
  Administered 2013-01-22: 150 mg via INTRAVENOUS

## 2013-01-22 MED ORDER — ONDANSETRON HCL 4 MG PO TABS
4.0000 mg | ORAL_TABLET | Freq: Four times a day (QID) | ORAL | Status: DC | PRN
  Administered 2013-01-23 – 2013-01-24 (×2): 4 mg via ORAL
  Filled 2013-01-22 (×2): qty 1

## 2013-01-22 MED ORDER — DEXAMETHASONE SODIUM PHOSPHATE 10 MG/ML IJ SOLN
INTRAMUSCULAR | Status: DC | PRN
  Administered 2013-01-22: 10 mg via INTRAVENOUS

## 2013-01-22 MED ORDER — DOCUSATE SODIUM 100 MG PO CAPS
100.0000 mg | ORAL_CAPSULE | Freq: Two times a day (BID) | ORAL | Status: DC
  Administered 2013-01-22 – 2013-01-24 (×4): 100 mg via ORAL

## 2013-01-22 MED ORDER — MENTHOL 3 MG MT LOZG: 1.0000 | LOZENGE | OROMUCOSAL | Status: DC | PRN

## 2013-01-22 MED ORDER — FENTANYL CITRATE 0.05 MG/ML IJ SOLN
INTRAMUSCULAR | Status: DC | PRN
  Administered 2013-01-22: 50 ug via INTRAVENOUS
  Administered 2013-01-22: 25 ug via INTRAVENOUS
  Administered 2013-01-22: 50 ug via INTRAVENOUS
  Administered 2013-01-22: 25 ug via INTRAVENOUS
  Administered 2013-01-22: 50 ug via INTRAVENOUS

## 2013-01-22 MED ORDER — SCOPOLAMINE 1 MG/3DAYS TD PT72
MEDICATED_PATCH | TRANSDERMAL | Status: AC
  Filled 2013-01-22: qty 1

## 2013-01-22 MED ORDER — ONDANSETRON HCL 4 MG/2ML IJ SOLN
4.0000 mg | Freq: Four times a day (QID) | INTRAMUSCULAR | Status: DC | PRN
  Administered 2013-01-22: 4 mg via INTRAVENOUS
  Filled 2013-01-22: qty 2

## 2013-01-22 MED ORDER — HYDROMORPHONE HCL PF 1 MG/ML IJ SOLN
0.2500 mg | INTRAMUSCULAR | Status: DC | PRN
Start: 2013-01-22 — End: 2013-01-22
  Administered 2013-01-22 (×4): 0.5 mg via INTRAVENOUS

## 2013-01-22 MED ORDER — ONDANSETRON HCL 4 MG PO TABS: 4.0000 mg | ORAL_TABLET | Freq: Four times a day (QID) | ORAL | Status: DC | PRN

## 2013-01-22 MED ORDER — MEPERIDINE HCL 50 MG/ML IJ SOLN: 6.2500 mg | INTRAMUSCULAR | Status: DC | PRN

## 2013-01-22 MED ORDER — BIOTIN 5000 MCG PO CAPS: ORAL_CAPSULE | Freq: Every day | ORAL | Status: DC

## 2013-01-22 MED ORDER — LACTATED RINGERS IV SOLN
INTRAVENOUS | Status: DC
  Administered 2013-01-22: 14:00:00 via INTRAVENOUS

## 2013-01-22 MED ORDER — FERROUS SULFATE 325 (65 FE) MG PO TABS
325.0000 mg | ORAL_TABLET | Freq: Three times a day (TID) | ORAL | Status: DC
  Administered 2013-01-22 – 2013-01-24 (×5): 325 mg via ORAL
  Filled 2013-01-22 (×8): qty 1

## 2013-01-22 MED ORDER — METOCLOPRAMIDE HCL 5 MG/ML IJ SOLN: 5.0000 mg | Freq: Three times a day (TID) | INTRAMUSCULAR | Status: DC | PRN

## 2013-01-22 MED ORDER — HYDROXYZINE HCL 25 MG PO TABS
25.0000 mg | ORAL_TABLET | Freq: Three times a day (TID) | ORAL | Status: DC | PRN
  Administered 2013-01-22 – 2013-01-23 (×2): 25 mg via ORAL
  Filled 2013-01-22 (×3): qty 1

## 2013-01-22 MED ORDER — HYDROXYZINE PAMOATE 25 MG PO CAPS: 25.0000 mg | ORAL_CAPSULE | Freq: Three times a day (TID) | ORAL | Status: DC | PRN

## 2013-01-22 MED ORDER — PHENOL 1.4 % MT LIQD: 1.0000 | OROMUCOSAL | Status: DC | PRN

## 2013-01-22 MED ORDER — STERILE WATER FOR IRRIGATION IR SOLN
Status: DC | PRN
  Administered 2013-01-22: 1500 mL

## 2013-01-22 MED ORDER — SODIUM CHLORIDE 0.9 % IV BOLUS (SEPSIS)
500.0000 mL | Freq: Once | INTRAVENOUS | Status: AC
  Administered 2013-01-22: 500 mL via INTRAVENOUS

## 2013-01-22 MED ORDER — SODIUM CHLORIDE 0.9 % IV SOLN
INTRAVENOUS | Status: DC
  Administered 2013-01-22 – 2013-01-24 (×2): via INTRAVENOUS
  Filled 2013-01-22 (×7): qty 1000

## 2013-01-22 MED ORDER — NORGESTIMATE-ETH ESTRADIOL 0.25-35 MG-MCG PO TABS
1.0000 | ORAL_TABLET | Freq: Every day | ORAL | Status: DC
  Administered 2013-01-22 – 2013-01-23 (×2): 1 via ORAL

## 2013-01-22 SURGICAL SUPPLY — 48 items
ADH SKN CLS APL DERMABOND .7 (GAUZE/BANDAGES/DRESSINGS) ×1
BAG SPEC THK2 15X12 ZIP CLS (MISCELLANEOUS)
BAG ZIPLOCK 12X15 (MISCELLANEOUS) ×1 IMPLANT
BANDAGE GAUZE ELAST BULKY 4 IN (GAUZE/BANDAGES/DRESSINGS) ×2 IMPLANT
BIT DRILL 3.8X6 NS (BIT) ×1 IMPLANT
BIT DRILL 5.3 (BIT) ×1 IMPLANT
CLOTH BEACON ORANGE TIMEOUT ST (SAFETY) ×1 IMPLANT
DERMABOND ADVANCED (GAUZE/BANDAGES/DRESSINGS) ×1
DERMABOND ADVANCED .7 DNX12 (GAUZE/BANDAGES/DRESSINGS) IMPLANT
DRAPE INCISE IOBAN 66X45 STRL (DRAPES) ×2 IMPLANT
DRAPE STERI IOBAN 125X83 (DRAPES) ×2 IMPLANT
DRSG AQUACEL AG ADV 3.5X 4 (GAUZE/BANDAGES/DRESSINGS) ×3 IMPLANT
DRSG AQUACEL AG ADV 3.5X 6 (GAUZE/BANDAGES/DRESSINGS) ×2 IMPLANT
DRSG PAD ABDOMINAL 8X10 ST (GAUZE/BANDAGES/DRESSINGS) ×2 IMPLANT
DURAPREP 26ML APPLICATOR (WOUND CARE) ×2 IMPLANT
ELECT REM PT RETURN 9FT ADLT (ELECTROSURGICAL) ×2
ELECTRODE REM PT RTRN 9FT ADLT (ELECTROSURGICAL) ×1 IMPLANT
GLOVE BIOGEL PI IND STRL 7.5 (GLOVE) ×1 IMPLANT
GLOVE BIOGEL PI IND STRL 8 (GLOVE) ×1 IMPLANT
GLOVE BIOGEL PI INDICATOR 7.5 (GLOVE) ×1
GLOVE BIOGEL PI INDICATOR 8 (GLOVE) ×1
GLOVE ECLIPSE 8.0 STRL XLNG CF (GLOVE) IMPLANT
GLOVE ORTHO TXT STRL SZ7.5 (GLOVE) ×4 IMPLANT
GLOVE SURG ORTHO 8.0 STRL STRW (GLOVE) ×2 IMPLANT
GOWN BRE IMP PREV XXLGXLNG (GOWN DISPOSABLE) ×2 IMPLANT
GOWN PREVENTION PLUS LG XLONG (DISPOSABLE) ×3 IMPLANT
GUIDEPIN 3.2X17.5 THRD DISP (PIN) ×1 IMPLANT
GUIDEWIRE BALL NOSE 80CM (WIRE) ×1 IMPLANT
KIT BASIN OR (CUSTOM PROCEDURE TRAY) ×2 IMPLANT
MANIFOLD NEPTUNE II (INSTRUMENTS) ×1 IMPLANT
NAIL TROCH ENTRY R 11X34CM (Nail) ×1 IMPLANT
PACK GENERAL/GYN (CUSTOM PROCEDURE TRAY) ×2 IMPLANT
PENCIL BUTTON HOLSTER BLD 10FT (ELECTRODE) ×1 IMPLANT
POSITIONER SURGICAL ARM (MISCELLANEOUS) ×3 IMPLANT
SCREW ACE CORTICAL (Screw) ×2 IMPLANT
SCREW ACECAP 42MM (Screw) ×1 IMPLANT
SCREW BN FT 60X6.5XST DRV (Screw) IMPLANT
SCREWDRIVER HEX TIP 3.5MM (MISCELLANEOUS) ×1 IMPLANT
SPONGE GAUZE 4X4 12PLY (GAUZE/BANDAGES/DRESSINGS) ×1 IMPLANT
STAPLER VISISTAT 35W (STAPLE) ×1 IMPLANT
SUT MNCRL AB 4-0 PS2 18 (SUTURE) ×1 IMPLANT
SUT VIC AB 1 CT1 27 (SUTURE) ×2
SUT VIC AB 1 CT1 27XBRD ANTBC (SUTURE) ×1 IMPLANT
SUT VIC AB 2-0 CT1 27 (SUTURE) ×2
SUT VIC AB 2-0 CT1 27XBRD (SUTURE) ×2 IMPLANT
TOWEL OR 17X26 10 PK STRL BLUE (TOWEL DISPOSABLE) ×3 IMPLANT
TOWEL OR NON WOVEN STRL DISP B (DISPOSABLE) ×1 IMPLANT
YANKAUER SUCT BULB TIP 10FT TU (MISCELLANEOUS) ×1 IMPLANT
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
## 2013-01-22 NOTE — Progress Notes (Signed)
PHARMACIST - PHYSICIAN ORDER COMMUNICATION  CONCERNING: P&T Medication Policy on Herbal Medications  DESCRIPTION:  This patient's order for:  biotin  has been noted.  This product(s) is classified as an "herbal" or natural product. Due to a lack of definitive safety studies or FDA approval, nonstandard manufacturing practices, plus the potential risk of unknown drug-drug interactions while on inpatient medications, the Pharmacy and Therapeutics Committee does not permit the use of "herbal" or natural products of this type within [facility]   ACTION TAKEN: The pharmacy department is unable to verify this order at this time and your patient has been informed of this safety policy. Please reevaluate patient's clinical condition at discharge and address if the herbal or natural product(s) should be resumed at that time.  Thanks, Dorethea Clan Pharm D 01/22/2013

## 2013-01-22 NOTE — Anesthesia Preprocedure Evaluation (Addendum)
Anesthesia Evaluation  Patient identified by MRN, date of birth, ID band Patient awake    Reviewed: Allergy & Precautions, H&P , NPO status , Patient's Chart, lab work & pertinent test results  History of Anesthesia Complications (+) PONV and history of anesthetic complications  Airway Mallampati: II TM Distance: >3 FB Neck ROM: Full    Dental no notable dental hx.    Pulmonary asthma ,  breath sounds clear to auscultation  Pulmonary exam normal       Cardiovascular negative cardio ROS  Rhythm:Regular Rate:Normal     Neuro/Psych negative neurological ROS  negative psych ROS   GI/Hepatic negative GI ROS, Neg liver ROS,   Endo/Other  negative endocrine ROS  Renal/GU negative Renal ROS  negative genitourinary   Musculoskeletal negative musculoskeletal ROS (+)   Abdominal   Peds negative pediatric ROS (+)  Hematology negative hematology ROS (+)   Anesthesia Other Findings   Reproductive/Obstetrics negative OB ROS                           Anesthesia Physical Anesthesia Plan  ASA: II  Anesthesia Plan: General   Post-op Pain Management:    Induction: Intravenous  Airway Management Planned: LMA  Additional Equipment:   Intra-op Plan:   Post-operative Plan: Extubation in OR  Informed Consent: I have reviewed the patients History and Physical, chart, labs and discussed the procedure including the risks, benefits and alternatives for the proposed anesthesia with the patient or authorized representative who has indicated his/her understanding and acceptance.   Dental advisory given  Plan Discussed with: CRNA  Anesthesia Plan Comments:         Anesthesia Quick Evaluation  
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
## 2013-01-22 NOTE — Preoperative (Signed)
Beta Blockers   Reason not to administer Beta Blockers:Not Applicable 
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
## 2013-01-22 NOTE — Progress Notes (Signed)
Dr. Charlann Boxer in- made saw drainage on right hip dessing

## 2013-01-22 NOTE — Progress Notes (Signed)
Theresa Breech, PA notified of large amount of drainage collecting under the right hip dressing. Orders given to change dsg PRN.

## 2013-01-22 NOTE — Progress Notes (Signed)
Dr. Acey Lav made aware of patient's heart rates in PACU and blood pressure

## 2013-01-22 NOTE — Brief Op Note (Signed)
01/22/2013  1:56 PM  PATIENT:  Theresa Lawrence  27 y.o. female  PRE-OPERATIVE DIAGNOSIS:  NON UNION RIGHT midshaft FEMUR fracture  POST-OPERATIVE DIAGNOSIS:  NON UNION RIGHT midshaft FEMUR fracture  PROCEDURE:  Procedure(s): EXCHANGE RIGHT FEMUR NAILING (Right)  Biomet  SURGEON:  Surgeon(s) and Role:    * Shelda Pal, MD - Primary  PHYSICIAN ASSISTANT: no  ANESTHESIA:   general  EBL:  Total I/O In: 900 [I.V.:900] Out: 250 [Urine:150; Blood:100]  BLOOD ADMINISTERED:none  DRAINS: none   LOCAL MEDICATIONS USED:  NONE  SPECIMEN:  No Specimen  DISPOSITION OF SPECIMEN:  N/A  COUNTS:  YES  TOURNIQUET:  * No tourniquets in log *  DICTATION: .Other Dictation: Dictation Number [id number]  PLAN OF CARE: Admit to inpatient   PATIENT DISPOSITION:  PACU - hemodynamically stable.   Delay start of Pharmacological VTE agent (>24hrs) due to surgical blood loss or risk of bleeding: no

## 2013-01-22 NOTE — Anesthesia Postprocedure Evaluation (Signed)
  Anesthesia Post-op Note  Patient: Theresa Lawrence  Procedure(s) Performed: Procedure(s) (LRB): EXCHANGE RIGHT FEMUR NAILING (Right)  Patient Location: PACU  Anesthesia Type: General  Level of Consciousness: awake and alert   Airway and Oxygen Therapy: Patient Spontanous Breathing  Post-op Pain: mild  Post-op Assessment: Post-op Vital signs reviewed, Patient's Cardiovascular Status Stable, Respiratory Function Stable, Patent Airway and No signs of Nausea or vomiting  Last Vitals:  Filed Vitals:   01/22/13 1445  BP: 118/85  Pulse: 93  Temp: 36.8 C  Resp: 12    Post-op Vital Signs: stable   Complications: No apparent anesthesia complications

## 2013-01-23 ENCOUNTER — Encounter (HOSPITAL_COMMUNITY): Payer: Self-pay | Admitting: Orthopedic Surgery

## 2013-01-23 LAB — BASIC METABOLIC PANEL
CO2: 26 mEq/L (ref 19–32)
Chloride: 103 mEq/L (ref 96–112)
GFR calc non Af Amer: >90 mL/min (ref >90)
Glucose, Bld: 170 mg/dL — ABNORMAL HIGH (ref 70–99)
Potassium: 4.2 mEq/L (ref 3.5–5.1)
Sodium: 135 mEq/L (ref 135–145)

## 2013-01-23 LAB — CBC
HCT: 27.6 % — ABNORMAL LOW (ref 36.0–46.0)
Hemoglobin: 9.2 g/dL — ABNORMAL LOW (ref 12.0–15.0)
RBC: 3.12 MIL/uL — ABNORMAL LOW (ref 3.87–5.11)
WBC: 13.7 10*3/uL — ABNORMAL HIGH (ref 4.0–10.5)

## 2013-01-23 LAB — URINE CULTURE: Culture: NO GROWTH

## 2013-01-23 MED ORDER — ASPIRIN 325 MG PO TBEC: 325.0000 mg | DELAYED_RELEASE_TABLET | Freq: Two times a day (BID) | ORAL | Status: DC

## 2013-01-23 MED ORDER — SODIUM CHLORIDE 0.9 % IV BOLUS (SEPSIS)
500.0000 mL | Freq: Once | INTRAVENOUS | Status: AC
  Administered 2013-01-23: 500 mL via INTRAVENOUS

## 2013-01-23 MED ORDER — TIZANIDINE HCL 4 MG PO TABS
4.0000 mg | ORAL_TABLET | Freq: Three times a day (TID) | ORAL | Status: DC | PRN
  Administered 2013-01-23 (×4): 4 mg via ORAL
  Filled 2013-01-23 (×5): qty 1

## 2013-01-23 MED ORDER — POLYETHYLENE GLYCOL 3350 17 G PO PACK: 17.0000 g | PACK | Freq: Every day | ORAL | Status: DC | PRN

## 2013-01-23 MED ORDER — DSS 100 MG PO CAPS: 100.0000 mg | ORAL_CAPSULE | Freq: Two times a day (BID) | ORAL | Status: DC

## 2013-01-23 MED ORDER — HYDROXYZINE HCL 25 MG PO TABS
25.0000 mg | ORAL_TABLET | Freq: Four times a day (QID) | ORAL | Status: DC | PRN
  Administered 2013-01-23 – 2013-01-24 (×4): 25 mg via ORAL
  Filled 2013-01-23 (×4): qty 1

## 2013-01-23 MED ORDER — HYDROCODONE-ACETAMINOPHEN 7.5-325 MG PO TABS: 1.0000 | ORAL_TABLET | ORAL | Status: DC

## 2013-01-23 MED ORDER — HYDROMORPHONE HCL 2 MG PO TABS
2.0000 mg | ORAL_TABLET | ORAL | Status: DC
  Administered 2013-01-23: 4 mg via ORAL
  Administered 2013-01-23 – 2013-01-24 (×4): 2 mg via ORAL
  Filled 2013-01-23 (×2): qty 1
  Filled 2013-01-23: qty 2
  Filled 2013-01-23 (×2): qty 1

## 2013-01-23 MED ORDER — ONDANSETRON HCL 4 MG PO TABS: 4.0000 mg | ORAL_TABLET | Freq: Four times a day (QID) | ORAL | Status: DC | PRN

## 2013-01-23 MED ORDER — HYDROXYZINE HCL 25 MG PO TABS: 25.0000 mg | ORAL_TABLET | Freq: Four times a day (QID) | ORAL | Status: DC | PRN

## 2013-01-23 MED ORDER — TIZANIDINE HCL 4 MG PO TABS: 4.0000 mg | ORAL_TABLET | Freq: Three times a day (TID) | ORAL | Status: DC | PRN

## 2013-01-23 MED ORDER — FERROUS SULFATE 325 (65 FE) MG PO TABS: 325.0000 mg | ORAL_TABLET | Freq: Three times a day (TID) | ORAL | Status: DC

## 2013-01-23 MED ORDER — HYDROCODONE-ACETAMINOPHEN 7.5-325 MG PO TABS
1.0000 | ORAL_TABLET | ORAL | Status: DC
  Administered 2013-01-23 (×2): 2 via ORAL
  Filled 2013-01-23 (×2): qty 2
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
## 2013-01-23 NOTE — Progress Notes (Signed)
Patient hypotensive but asymptomatic.  Ralene Bathe PA called, new orders received.  Will continue to monitor.
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
## 2013-01-23 NOTE — Progress Notes (Addendum)
   Subjective: 1 Day Post-Op Procedure(s) (LRB): EXCHANGE RIGHT FEMUR NAILING (Right)   Patient reports pain as mild, pain controlled but not well. No other events throughout the night.  Objective:   VITALS:   Filed Vitals:   01/23/13  BP: 92/59  Pulse: 99  Temp: 97.8 F (36.6 C)   Resp: 16    Neurovascular intact Dorsiflexion/Plantar flexion intact Incision: scant drainage No cellulitis present Compartment soft  LABS  Recent Labs  01/22/13 0855 01/23/13 0505  HGB 12.2 9.2*  HCT 36.7 27.6*  WBC 6.4 13.7*  PLT 307 249     Recent Labs  01/22/13 0855 01/23/13 0505  NA 138 135  K 3.7 4.2  BUN 10 9  CREATININE 0.86 0.75  GLUCOSE 90 170*     Assessment/Plan: 1 Day Post-Op Procedure(s) (LRB): EXCHANGE RIGHT FEMUR NAILING (Right) Foley cath d/c'ed Advance diet Up with therapy Discharge home with home health eventually, when ready Give bolus of fluid for hypotension. Itching a lot, decrease duration between vistaril Increasing Norco and decreasing duration   Expected ABLA  Treated with iron and will observe      Anastasio Auerbach. Deyonna Fitzsimmons   PAC  01/23/2013, 8:43 AM

## 2013-01-23 NOTE — Care Management Note (Signed)
  Page 1 of 1   01/23/2013     1:01:42 PM   CARE MANAGEMENT NOTE 01/23/2013  Patient:  Theresa Lawrence, Theresa Lawrence   Account Number:  [id number]  Date Initiated:  01/23/2013  Documentation initiated by:  Colleen Can  Subjective/Objective Assessment:   dx non union femur fx; Exchange IM nailing right femur     Action/Plan:   Home upon discharge   Anticipated DC Date:  01/24/2013   Anticipated DC Plan:  HOME/SELF CARE         Choice offered to / List presented to:             Status of service:  In process, will continue to follow Medicare Important Message given?   (If response is "NO", the following Medicare IM given date fields will be blank) Date Medicare IM given:   Date Additional Medicare IM given:    Discharge Disposition:    Per UR Regulation:  Reviewed for med. necessity/level of care/duration of stay  If discussed at Long Length of Stay Meetings, dates discussed:    Comments:

## 2013-01-24 LAB — CBC
Hemoglobin: 9.2 g/dL — ABNORMAL LOW (ref 12.0–15.0)
MCHC: 31.6 g/dL (ref 30.0–36.0)
MCV: 90.9 fL (ref 78.0–100.0)
Platelets: 246 10*3/uL (ref 150–400)
RBC: 3.2 MIL/uL — ABNORMAL LOW (ref 3.87–5.11)
RDW: 13.8 % (ref 11.5–15.5)
WBC: 13.1 10*3/uL — ABNORMAL HIGH (ref 4.0–10.5)

## 2013-01-24 MED ORDER — HYDROMORPHONE HCL 2 MG PO TABS: 2.0000 mg | ORAL_TABLET | ORAL | Status: DC | PRN

## 2013-01-24 NOTE — Progress Notes (Signed)
Subjective: 2 Days Post-Op Procedure(s) (LRB): EXCHANGE RIGHT FEMUR NAILING (Right) Patient reports pain as well controlled with med change.  Soreness to RLE. Tolerating PO's well. Ambulating well. Mother at bedside. Denies SOB, CP, Or calf pain. NO N/V/C.  Objective: Vital signs in last 24 hours: Temp:  [97.7 F (36.5 C)-99 F (37.2 C)] 98.5 F (36.9 C) (11/01 0459) Pulse Rate:  [84-101] 92 (11/01 0459) Resp:  [14-18] 17 (11/01 0459) BP: (88-100)/(52-68) 100/68 mmHg (11/01 0459) SpO2:  [98 %-100 %] 99 % (10/31 2100)  Intake/Output from previous day: 10/31 0701 - 11/01 0700 In: 1838.8 [P.O.:600; I.V.:1238.8] Out: 850 [Urine:850] Intake/Output this shift:     Recent Labs  01/22/13 0855 01/23/13 0505  HGB 12.2 9.2*    Recent Labs  01/22/13 0855 01/23/13 0505  WBC 6.4 13.7*  RBC 4.23 3.12*  HCT 36.7 27.6*  PLT 307 249    Recent Labs  01/22/13 0855 01/23/13 0505  NA 138 135  K 3.7 4.2  CL 103 103  CO2 26 26  BUN 10 9  CREATININE 0.86 0.75  GLUCOSE 90 170*  CALCIUM 9.4 8.5    Recent Labs  01/22/13 0855  INR 0.98    Alert and oriented. RRR, ABD: S/NT, Lungs clear. Right hip dressing dry and intact. Mildly tender. Calf s/NT. Neurovascularly intact.  Assessment/Plan: 2 Days Post-Op Procedure(s) (LRB): EXCHANGE RIGHT FEMUR NAILING (Right) D/C home Today Change to Dilaudid RX done Check CBC prior to D/C UP with PT this am Follow up in office as directed, call for appointment [phone] Follow D/C instructions and medications directions   Theresa Lawrence L 01/24/2013, 7:50 AM

## 2013-01-24 NOTE — Progress Notes (Signed)
Pt discharged to home. DC instructions given with family members to include mother at bedside. . Prescriptions x 5 also given.left unit in wheelchair pushed by female family member. Left in good condition. Vwilliams,rn.

## 2013-01-24 NOTE — Progress Notes (Signed)
Agree with above 
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
## 2013-01-24 NOTE — Progress Notes (Signed)
   CARE MANAGEMENT NOTE 01/24/2013  Patient:  Theresa Lawrence, Theresa Lawrence   Account Number:  [id number]  Date Initiated:  01/23/2013  Documentation initiated by:  Colleen Can  Subjective/Objective Assessment:   dx non union femur fx; Exchange IM nailing right femur     Action/Plan:   Home upon discharge   Anticipated DC Date:  01/24/2013   Anticipated DC Plan:  HOME/SELF CARE      DC Planning Services  CM consult      Choice offered to / List presented to:             Status of service:  Completed, signed off Medicare Important Message given?   (If response is "NO", the following Medicare IM given date fields will be blank) Date Medicare IM given:   Date Additional Medicare IM given:    Discharge Disposition:    Per UR Regulation:  Reviewed for med. necessity/level of care/duration of stay  If discussed at Long Length of Stay Meetings, dates discussed:    Comments:  01/24/2013 1045 NCM spoke to pt and states this is her second surgery. States she had therapy in May and is aware of exercises to do at home. She has RW, wheelchair and 3n1 at home. She did make the surgeon aware that she was not interested in [facility] post discharge. She will follow up with surgeon and plan is eventually go back for outpt PT. Isidoro Donning RN CCM Case Mgmt phone [phone]

## 2013-01-26 NOTE — Discharge Summary (Signed)
Physician Discharge Summary  Patient ID: Theresa Lawrence MRN: [id number] DOB/AGE: 1986-02-28 26 y.o.  Admit date: 01/22/2013 Discharge date: 01/24/2013   Procedures:  Procedure(s) (LRB): EXCHANGE RIGHT FEMUR NAILING (Right)  Attending Physician:  Dr. Durene Romans   Admission Diagnoses:   Persistently painful right thigh following ORIF right femur fracture   Discharge Diagnoses:   S/P exchange intramedullary nailing right femur    Past Medical History  Diagnosis Date  . Migraine   . Asthma   . Miscarriage within last 12 months 08/11/2012  . Sinus tachycardia   . Bicuspid aortic valve   . PONV (postoperative nausea and vomiting)   . GERD (gastroesophageal reflux disease)   . Anemia     HPI:    Pt is a 27 y.o. female complaining of persistent right thigh pain greater than 5 months out from ORIF of a right femur fracture. 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 Various options were discussed and she would like to proceed with surgery. .Risks, benefits and expectations were discussed with the patient. Patient understand the risks, benefits and expectations and wishes to proceed with surgery.   PCP: Gabriel Cirri, DO   Discharged Condition: good  Hospital Course:  Patient underwent the above stated procedure on 01/22/2013. Patient tolerated the procedure well and brought to the recovery room in good condition and subsequently to the floor.  POD #1 BP: 92/59 ; Pulse: 99 ; Temp: 97.8 F (36.6 C) ; Resp: 16 Patient reports pain as mild, pain controlled but not well. No other events throughout the night. Neurovascular intact, dorsiflexion/plantar flexion intact, incision: dressing scant drainage, no cellulitis present and compartment soft.   LABS  Basename    HGB  9.2  HCT  27.6   POD #2  BP: 100/68 ; Pulse: 92 ; Temp: 98.5 F (36.9 C) ; Resp: 17 Patient reports pain as well controlled with med change. Soreness to RLE. Tolerating PO's well. Ambulating  well. Mother at bedside. Denies SOB, CP, Or calf pain. NO N/V/C. Neurovascular intact, dorsiflexion/plantar flexion intact, incision: dressing C/D/I, no cellulitis present and compartment soft.   LABS   No new labs  Discharge Exam: General appearance: alert, cooperative and no distress Extremities: Homans sign is negative, no sign of DVT, no edema, redness or tenderness in the calves or thighs and no ulcers, gangrene or trophic changes  Disposition:   Home or Self Care with follow up in 2 weeks   Follow-up Information   Follow up with Shelda Pal, MD. Schedule an appointment as soon as possible for a visit in 2 weeks.   Specialty:  Orthopedic Surgery   Contact information:   [address] [place] [address] [phone]       Discharge Orders   Future Orders Complete By Expires   Call MD / Call 911  As directed    Comments:     If you experience chest pain or shortness of breath, CALL 911 and be transported to the hospital emergency room.  If you develope a fever above 101 F, pus (white drainage) or increased drainage or redness at the wound, or calf pain, call your surgeon's office.   Constipation Prevention  As directed    Comments:     Drink plenty of fluids.  Prune juice may be helpful.  You may use a stool softener, such as Colace (over the counter) 100 mg twice a day.  Use MiraLax (over the counter) for constipation as needed.   Diet -  low sodium heart healthy  As directed    Discharge instructions  As directed    Comments:     Maintain surgical dressing for 10-14 days, then replace with gauze and tape. Keep the area dry and clean until follow up. Follow up in 2 weeks at [facility]. Call with any questions or concerns.   Driving restrictions  As directed    Comments:     No driving for 4 weeks   Increase activity slowly as tolerated  As directed    Weight bearing as tolerated  As directed    Questions:     Laterality:     Extremity:           Medication List    STOP taking these medications       HYDROcodone-acetaminophen 5-325 MG per tablet  Commonly known as:  NORCO  Replaced by:  HYDROcodone-acetaminophen 7.5-325 MG per tablet     hydrOXYzine 25 MG capsule  Commonly known as:  VISTARIL     ketoprofen 75 MG capsule  Commonly known as:  ORUDIS     tiZANidine 4 MG capsule  Commonly known as:  ZANAFLEX  Replaced by:  tiZANidine 4 MG tablet      TAKE these medications       albuterol 108 (90 BASE) MCG/ACT inhaler  Commonly known as:  PROVENTIL HFA;VENTOLIN HFA  Inhale 2 puffs into the lungs every 4 (four) hours as needed for wheezing.     aspirin 325 MG EC tablet  Take 1 tablet (325 mg total) by mouth 2 (two) times daily.     Biotin 5000 MCG Caps  Take by mouth daily.     DSS 100 MG Caps  Take 100 mg by mouth 2 (two) times daily.     esomeprazole 40 MG capsule  Commonly known as:  NEXIUM  Take 40 mg by mouth daily before breakfast.     ferrous sulfate 325 (65 FE) MG tablet  Take 1 tablet (325 mg total) by mouth 3 (three) times daily after meals.     fexofenadine 60 MG tablet  Commonly known as:  ALLEGRA  Take 60 mg by mouth daily. Prn allergies     HYDROcodone-acetaminophen 7.5-325 MG per tablet  Commonly known as:  NORCO  Take 1-2 tablets by mouth every 4 (four) hours.     HYDROmorphone 2 MG tablet  Commonly known as:  DILAUDID  Take 1-2 tablets (2-4 mg total) by mouth every 4 (four) hours as needed for pain.     hydrOXYzine 25 MG tablet  Commonly known as:  ATARAX/VISTARIL  Take 1 tablet (25 mg total) by mouth every 6 (six) hours as needed for itching.     imipramine 50 MG tablet  Commonly known as:  TOFRANIL  Take 100 mg by mouth at bedtime.     metoCLOPramide 10 MG tablet  Commonly known as:  REGLAN  10 mg. As needed for migraines     norgestimate-ethinyl estradiol 0.25-35 MG-MCG tablet  Commonly known as:  ORTHO-CYCLEN,SPRINTEC,PREVIFEM  Take 1 tablet by mouth daily.      ondansetron 4 MG tablet  Commonly known as:  ZOFRAN  Take 1 tablet (4 mg total) by mouth every 6 (six) hours as needed for nausea.     polyethylene glycol packet  Commonly known as:  MIRALAX / GLYCOLAX  Take 17 g by mouth daily as needed.     silver sulfADIAZINE 1 % cream  Commonly known as:  SILVADENE  Apply  topically daily. Uses occasionally     SUMAtriptan 100 MG tablet  Commonly known as:  IMITREX  Take 100 mg by mouth every 2 (two) hours as needed for migraine. May repeat in 2 hours if headache persists or recurs.     tiZANidine 4 MG tablet  Commonly known as:  ZANAFLEX  Take 1 tablet (4 mg total) by mouth 3 (three) times daily as needed (muscle spasms and pain).         Signed: Anastasio Auerbach. Aundraya Dripps   PAC  01/26/2013, 12:39 PM

## 2013-07-26 ENCOUNTER — Other Ambulatory Visit: Payer: Self-pay | Admitting: Nurse Practitioner

## 2013-07-27 NOTE — Telephone Encounter (Signed)
Last AEX and refill 05/23/2012 #3 mos/ 3 refills No future appt scheduled.   Will refill one month. Patient will need AEX for further refills.

## 2013-08-24 ENCOUNTER — Other Ambulatory Visit: Payer: Self-pay | Admitting: Gynecology

## 2013-08-24 MED ORDER — NORGESTIMATE-ETH ESTRADIOL 0.25-35 MG-MCG PO TABS: 1.0000 | ORAL_TABLET | Freq: Every day | ORAL | Status: DC

## 2013-08-24 NOTE — Telephone Encounter (Signed)
AEX 05/23/2012 Last refill 07/26/13 #28/0 refills Next appt 09/02/13  Rx sent to last until appt.

## 2013-09-02 ENCOUNTER — Ambulatory Visit (INDEPENDENT_AMBULATORY_CARE_PROVIDER_SITE_OTHER): Payer: BC Managed Care – PPO | Admitting: Nurse Practitioner

## 2013-09-02 ENCOUNTER — Encounter: Payer: Self-pay | Admitting: Nurse Practitioner

## 2013-09-02 VITALS — BP 112/68 | HR 76 | Ht 63.0 in | Wt 128.0 lb

## 2013-09-02 DIAGNOSIS — Z0000 Encounter for general adult medical examination without abnormal findings: Secondary | ICD-10-CM

## 2013-09-02 DIAGNOSIS — N390 Urinary tract infection, site not specified: Secondary | ICD-10-CM

## 2013-09-02 DIAGNOSIS — Z01419 Encounter for gynecological examination (general) (routine) without abnormal findings: Secondary | ICD-10-CM

## 2013-09-02 DIAGNOSIS — Z113 Encounter for screening for infections with a predominantly sexual mode of transmission: Secondary | ICD-10-CM

## 2013-09-02 LAB — LIPID PANEL
CHOL/HDL RATIO: 3.3 ratio
CHOLESTEROL: 200 mg/dL (ref 0–200)
HDL: 61 mg/dL (ref >39)
LDL CALC: 123 mg/dL — AB (ref 0–99)
TRIGLYCERIDES: 79 mg/dL (ref <150)
VLDL: 16 mg/dL (ref 0–40)

## 2013-09-02 LAB — COMPREHENSIVE METABOLIC PANEL
ALBUMIN: 3.9 g/dL (ref 3.5–5.2)
ALK PHOS: 72 U/L (ref 39–117)
ALT: 9 U/L (ref 0–35)
AST: 13 U/L (ref 0–37)
BILIRUBIN TOTAL: 0.3 mg/dL (ref 0.2–1.2)
BUN: 16 mg/dL (ref 6–23)
CO2: 27 mEq/L (ref 19–32)
CREATININE: 0.89 mg/dL (ref 0.50–1.10)
Calcium: 9 mg/dL (ref 8.4–10.5)
Chloride: 103 mEq/L (ref 96–112)
GLUCOSE: 104 mg/dL — AB (ref 70–99)
POTASSIUM: 4.5 meq/L (ref 3.5–5.3)
Sodium: 137 mEq/L (ref 135–145)
Total Protein: 6.4 g/dL (ref 6.0–8.3)

## 2013-09-02 LAB — POCT URINALYSIS DIPSTICK
BILIRUBIN UA: NEGATIVE
Glucose, UA: NEGATIVE
KETONES UA: NEGATIVE
NITRITE UA: POSITIVE
Protein, UA: NEGATIVE
Urobilinogen, UA: NEGATIVE
pH, UA: 7

## 2013-09-02 LAB — HEMOGLOBIN, FINGERSTICK: HEMOGLOBIN, FINGERSTICK: 12.4 g/dL (ref 12.0–16.0)

## 2013-09-02 LAB — TSH: TSH: 1.803 u[IU]/mL (ref 0.350–4.500)

## 2013-09-02 MED ORDER — NORGESTIMATE-ETH ESTRADIOL 0.25-35 MG-MCG PO TABS: 1.0000 | ORAL_TABLET | Freq: Every day | ORAL | Status: DC

## 2013-09-02 MED ORDER — NITROFURANTOIN MONOHYD MACRO 100 MG PO CAPS: 100.0000 mg | ORAL_CAPSULE | Freq: Two times a day (BID) | ORAL | Status: DC
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
## 2013-09-02 NOTE — Patient Instructions (Signed)
EXERCISE AND DIET:  We recommended that you start or continue a regular exercise program for good health. Regular exercise means any activity that makes your heart beat faster and makes you sweat.  We recommend exercising at least 30 minutes per day at least 3 days a week, preferably 4 or 5.  We also recommend a diet low in fat and sugar.  Inactivity, poor dietary choices and obesity can cause diabetes, heart attack, stroke, and kidney damage, among others.    ALCOHOL AND SMOKING:  Women should limit their alcohol intake to no more than 7 drinks/beers/glasses of wine (combined, not each!) per week. Moderation of alcohol intake to this level decreases your risk of breast cancer and liver damage. And of course, no recreational drugs are part of a healthy lifestyle.  And absolutely no smoking or even second hand smoke. Most people know smoking can cause heart and lung diseases, but did you know it also contributes to weakening of your bones? Aging of your skin?  Yellowing of your teeth and nails?  CALCIUM AND VITAMIN D:  Adequate intake of calcium and Vitamin D are recommended.  The recommendations for exact amounts of these supplements seem to change often, but generally speaking 600 mg of calcium (either carbonate or citrate) and 800 units of Vitamin D per day seems prudent. Certain women may benefit from higher intake of Vitamin D.  If you are among these women, your doctor will have told you during your visit.    PAP SMEARS:  Pap smears, to check for cervical cancer or precancers,  have traditionally been done yearly, although recent scientific advances have shown that most women can have pap smears less often.  However, every woman still should have a physical exam from her gynecologist every year. It will include a breast check, inspection of the vulva and vagina to check for abnormal growths or skin changes, a visual exam of the cervix, and then an exam to evaluate the size and shape of the uterus and  ovaries.  And after 28 years of age, a rectal exam is indicated to check for rectal cancers. We will also provide age appropriate advice regarding health maintenance, like when you should have certain vaccines, screening for sexually transmitted diseases, bone density testing, colonoscopy, mammograms, etc.   MAMMOGRAMS:  All women over 40 years old should have a yearly mammogram. Many facilities now offer a "3D" mammogram, which may cost around $50 extra out of pocket. If possible,  we recommend you accept the option to have the 3D mammogram performed.  It both reduces the number of women who will be called back for extra views which then turn out to be normal, and it is better than the routine mammogram at detecting truly abnormal areas.    COLONOSCOPY:  Colonoscopy to screen for colon cancer is recommended for all women at age 50.  We know, you hate the idea of the prep.  We agree, BUT, having colon cancer and not knowing it is worse!!  Colon cancer so often starts as a polyp that can be seen and removed at colonscopy, which can quite literally save your life!  And if your first colonoscopy is normal and you have no family history of colon cancer, most women don't have to have it again for 10 years.  Once every ten years, you can do something that may end up saving your life, right?  We will be happy to help you get it scheduled when you are ready.    Be sure to check your insurance coverage so you understand how much it will cost.  It may be covered as a preventative service at no cost, but you should check your particular policy.     Urinary Tract Infection Urinary tract infections (UTIs) can develop anywhere along your urinary tract. Your urinary tract is your body's drainage system for removing wastes and extra water. Your urinary tract includes two kidneys, two ureters, a bladder, and a urethra. Your kidneys are a pair of bean-shaped organs. Each kidney is about the size of your fist. They are located  below your ribs, one on each side of your spine. CAUSES Infections are caused by microbes, which are microscopic organisms, including fungi, viruses, and bacteria. These organisms are so small that they can only be seen through a microscope. Bacteria are the microbes that most commonly cause UTIs. SYMPTOMS  Symptoms of UTIs may vary by age and gender of the patient and by the location of the infection. Symptoms in young women typically include a frequent and intense urge to urinate and a painful, burning feeling in the bladder or urethra during urination. Older women and men are more likely to be tired, shaky, and weak and have muscle aches and abdominal pain. A fever may mean the infection is in your kidneys. Other symptoms of a kidney infection include pain in your back or sides below the ribs, nausea, and vomiting. DIAGNOSIS To diagnose a UTI, your caregiver will ask you about your symptoms. Your caregiver also will ask to provide a urine sample. The urine sample will be tested for bacteria and white blood cells. White blood cells are made by your body to help fight infection. TREATMENT  Typically, UTIs can be treated with medication. Because most UTIs are caused by a bacterial infection, they usually can be treated with the use of antibiotics. The choice of antibiotic and length of treatment depend on your symptoms and the type of bacteria causing your infection. HOME CARE INSTRUCTIONS  If you were prescribed antibiotics, take them exactly as your caregiver instructs you. Finish the medication even if you feel better after you have only taken some of the medication.  Drink enough water and fluids to keep your urine clear or pale yellow.  Avoid caffeine, tea, and carbonated beverages. They tend to irritate your bladder.  Empty your bladder often. Avoid holding urine for long periods of time.  Empty your bladder before and after sexual intercourse.  After a bowel movement, women should cleanse  from front to back. Use each tissue only once. SEEK MEDICAL CARE IF:   You have back pain.  You develop a fever.  Your symptoms do not begin to resolve within 3 days. SEEK IMMEDIATE MEDICAL CARE IF:   You have severe back pain or lower abdominal pain.  You develop chills.  You have nausea or vomiting.  You have continued burning or discomfort with urination. MAKE SURE YOU:   Understand these instructions.  Will watch your condition.  Will get help right away if you are not doing well or get worse. Document Released: 12/20/2004 Document Revised: 09/11/2011 Document Reviewed: 04/20/2011 [employer] Patient Information 2014 [employer].  

## 2013-09-03 LAB — STD PANEL
HIV 1&2 Ab, 4th Generation: NONREACTIVE
Hepatitis B Surface Ag: NEGATIVE

## 2013-09-03 LAB — IPS N GONORRHOEA AND CHLAMYDIA BY PCR

## 2013-09-03 LAB — URINALYSIS, MICROSCOPIC ONLY
Bacteria, UA: NONE SEEN
CASTS: NONE SEEN
Crystals: NONE SEEN
SQUAMOUS EPITHELIAL / LPF: NONE SEEN

## 2013-09-04 LAB — URINE CULTURE

## 2013-09-04 LAB — IPS PAP TEST WITH HPV

## 2013-09-07 ENCOUNTER — Telehealth: Payer: Self-pay | Admitting: *Deleted

## 2013-09-07 NOTE — Telephone Encounter (Signed)
Pt notified in result note.  

## 2013-09-07 NOTE — Telephone Encounter (Signed)
I have attempted to contact this patient by phone with the following results: left message to return my call on answering machine (mobile per DPR).  

## 2013-09-07 NOTE — Telephone Encounter (Signed)
Message copied by Luisa DagoPHILLIPS, STEPHANIE C on Mon Sep 07, 2013  9:03 AM ------      Message from: Verner CholLEONARD, DEBORAH S      Created: Fri Sep 04, 2013 12:39 PM       Notify patient that culture was positive for E. Coli on appropriate medication and micro was positive      Will need TOC  Micro and culture in 2 weeks, order in ------

## 2013-09-07 NOTE — Progress Notes (Signed)
Encounter reviewed by Dr. Alin Hutchins Silva.  

## 2013-10-08 ENCOUNTER — Telehealth: Payer: Self-pay | Admitting: Nurse Practitioner

## 2013-10-08 NOTE — Telephone Encounter (Signed)
Patient is not sure if she needs to come back for a urine reck. Patient is not sure if she needs to see Patty or just leave a sample.

## 2013-10-08 NOTE — Telephone Encounter (Signed)
Called patient. Needs Urine test of cure nurse appointment. She is agreeable and will come by tomorrow at 1430 for nurse appointment.  Routing to provider for final review. Patient agreeable to disposition. Will close encounter

## 2013-10-09 ENCOUNTER — Ambulatory Visit (INDEPENDENT_AMBULATORY_CARE_PROVIDER_SITE_OTHER): Payer: BC Managed Care – PPO | Admitting: Gynecology

## 2013-10-09 VITALS — BP 104/70 | HR 72 | Ht 63.0 in | Wt 131.0 lb

## 2013-10-09 DIAGNOSIS — N390 Urinary tract infection, site not specified: Secondary | ICD-10-CM

## 2013-10-09 LAB — POCT URINALYSIS DIPSTICK
Bilirubin, UA: NEGATIVE
Glucose, UA: NEGATIVE
Ketones, UA: NEGATIVE
NITRITE UA: NEGATIVE
Protein, UA: NEGATIVE
UROBILINOGEN UA: NEGATIVE
pH, UA: 5
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
## 2013-10-10 LAB — URINALYSIS, MICROSCOPIC ONLY
BACTERIA UA: NONE SEEN
Casts: NONE SEEN
Crystals: NONE SEEN
Squamous Epithelial / LPF: NONE SEEN

## 2013-10-11 LAB — URINE CULTURE: Colony Count: 10000

## 2013-10-13 ENCOUNTER — Telehealth: Payer: Self-pay

## 2013-10-13 NOTE — Telephone Encounter (Signed)
LMOM to contact office concerning results. 

## 2013-10-13 NOTE — Telephone Encounter (Signed)
Message copied by Brooks SailorsHARGROVE, Chrishon Martino S on Tue Oct 13, 2013  9:37 AM ------      Message from: Douglass RiversLATHROP, TRACY      Created: Mon Oct 12, 2013  4:18 PM       Inform culture unchanged, should either f/u here when comes back from beach or if it gets worse can go to urgent care ------

## 2013-10-15 NOTE — Telephone Encounter (Signed)
Informed pt of results. Dr. Farrel GobbleLathrop, pt is actually back from vacation due to a death in her family. She wanted to know did you want her to be on any rx or come back in

## 2013-10-16 NOTE — Telephone Encounter (Signed)
Sorry about her loss, if she is back in town and still having symptoms, we can see her back

## 2013-10-16 NOTE — Telephone Encounter (Signed)
LMOM to contact office 

## 2013-10-16 NOTE — Telephone Encounter (Signed)
Patient returning Shannon's call.

## 2013-10-16 NOTE — Telephone Encounter (Signed)
Pt informed. Will call if she has any sx. Encounter closed

## 2013-11-06 ENCOUNTER — Other Ambulatory Visit: Payer: Self-pay | Admitting: Orthopedic Surgery

## 2013-11-06 DIAGNOSIS — S7291XD Unspecified fracture of right femur, subsequent encounter for closed fracture with routine healing: Secondary | ICD-10-CM

## 2013-11-16 ENCOUNTER — Ambulatory Visit
Admission: RE | Admit: 2013-11-16 | Discharge: 2013-11-16 | Disposition: A | Discharge status: Home / Self Care | Payer: BC Managed Care – PPO | Source: Ambulatory Visit | Attending: Orthopedic Surgery | Admitting: Orthopedic Surgery

## 2013-11-16 DIAGNOSIS — S7291XD Unspecified fracture of right femur, subsequent encounter for closed fracture with routine healing: Secondary | ICD-10-CM

## 2014-01-25 ENCOUNTER — Encounter: Payer: Self-pay | Admitting: Nurse Practitioner

## 2014-02-17 IMAGING — CT CT ANGIO CHEST
2 of 6 series · 18 of 46 positions shown · IV contrast (APPLIED)
Comparison: None

CLINICAL DATA: Syncope.

CT ANGIOGRAPHY CHEST
TECHNIQUE: Multidetector CT imaging of the chest using the
standard protocol during bolus administration of intravenous
contrast. Multiplanar reconstructed images including MIPs were
obtained and reviewed to evaluate the vascular anatomy.
Contrast: 100mL OMNIPAQUE IOHEXOL 350 MG/ML SOLN

[Series 6: pulm embolism 1.0 b25f thin · axial · 0.60mm/px · z∈[-258,-27]mm · 15 of 253 slices shown]
[im 11/253  lung]
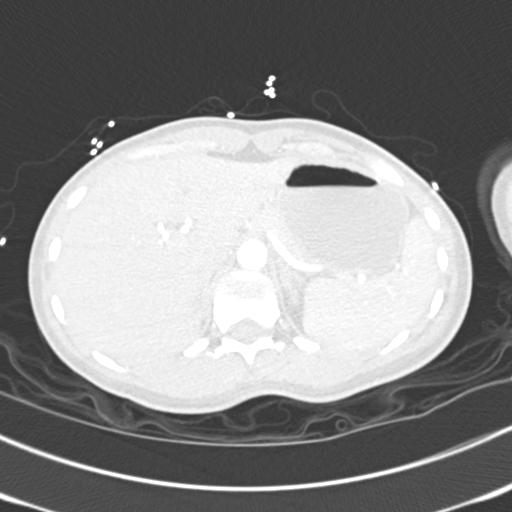
[im 33/253  soft-tissue]
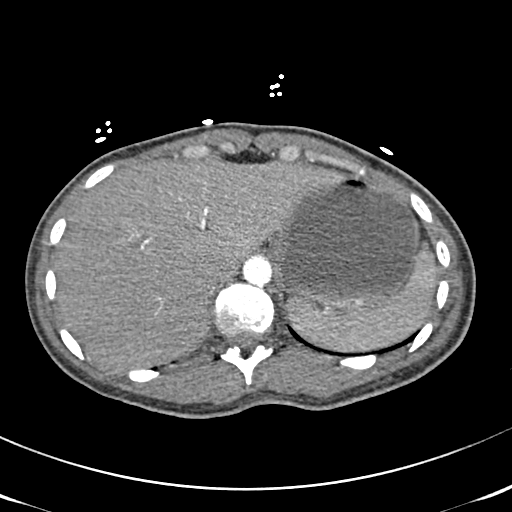
[im 44/253  lung]
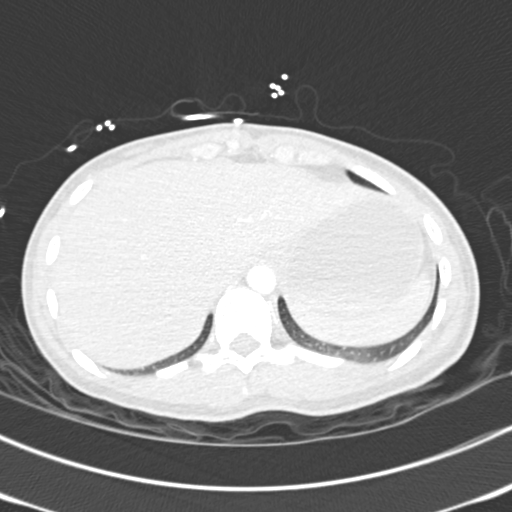
[im 66/253  soft-tissue]
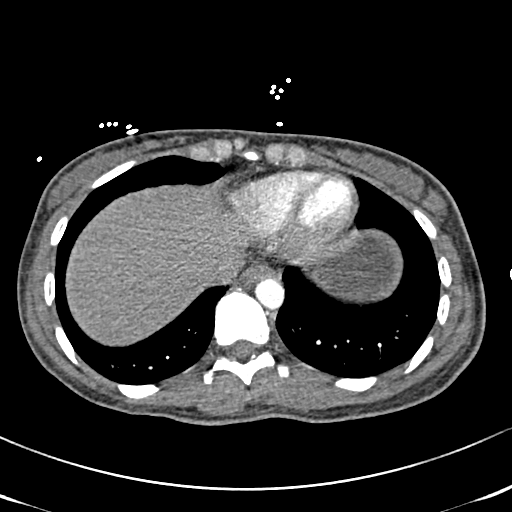
[im 77/253  lung]
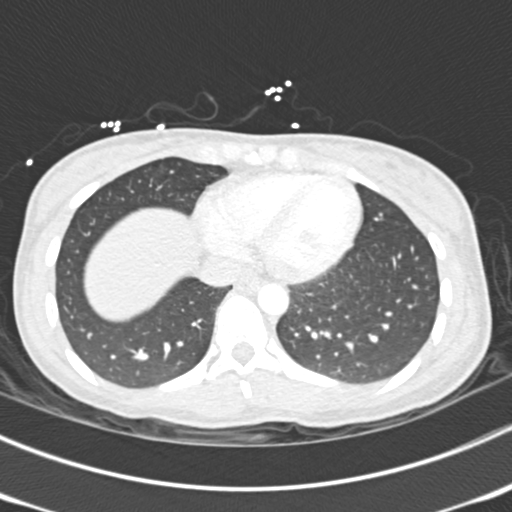
[im 99/253  soft-tissue]
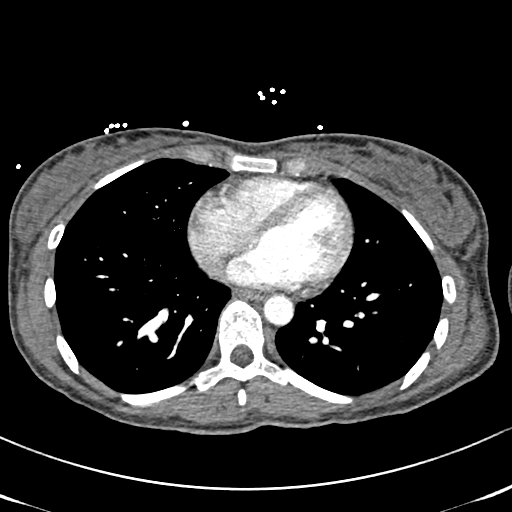
[im 110/253  lung]
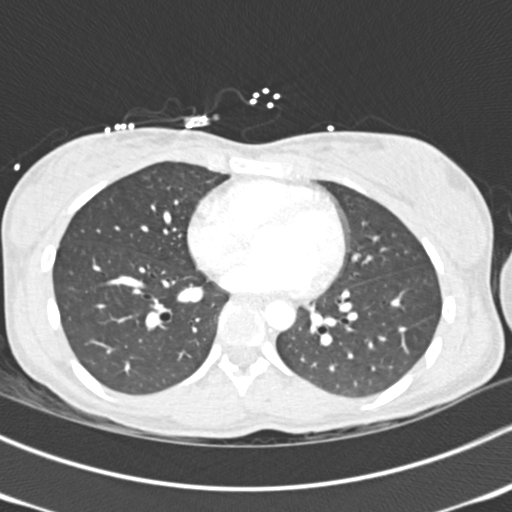
[im 132/253  soft-tissue]
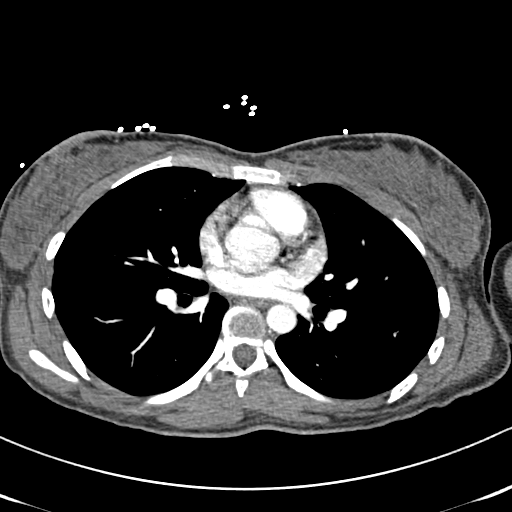
[im 143/253  lung]
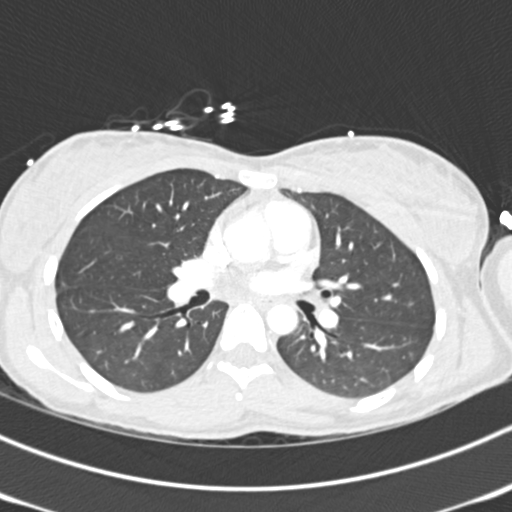
[im 154/253  soft-tissue]
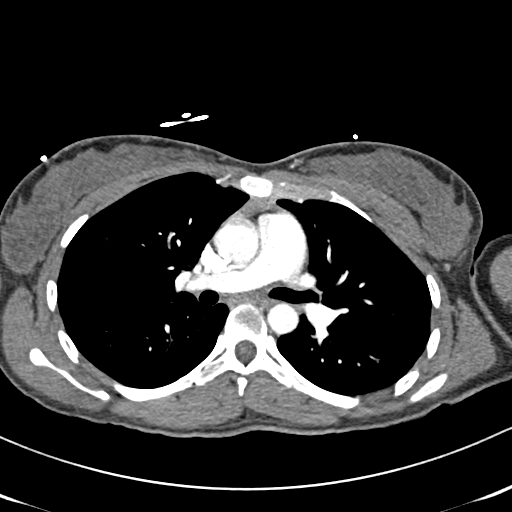
[im 176/253  lung]
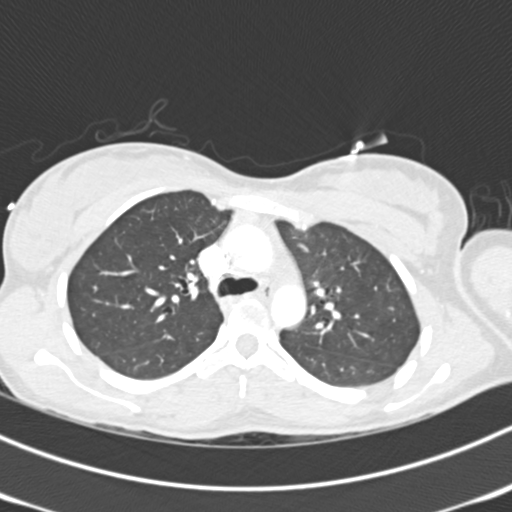
[im 187/253  soft-tissue]
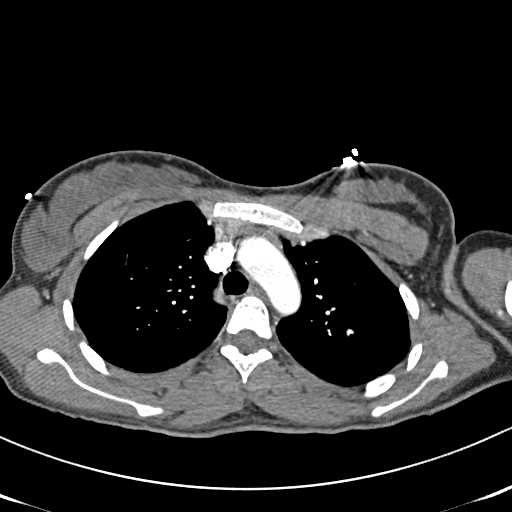
[im 209/253  lung]
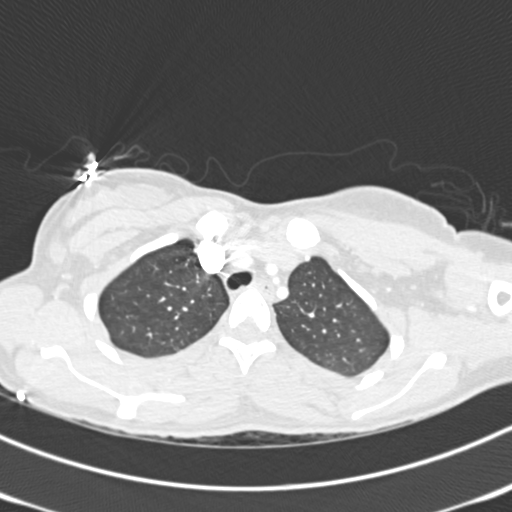
[im 220/253  soft-tissue]
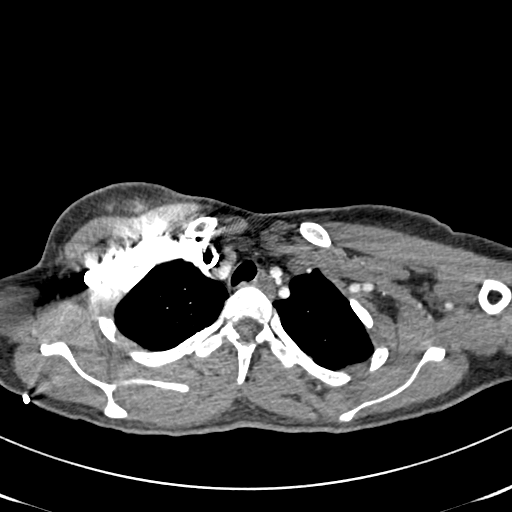
[im 242/253  lung]
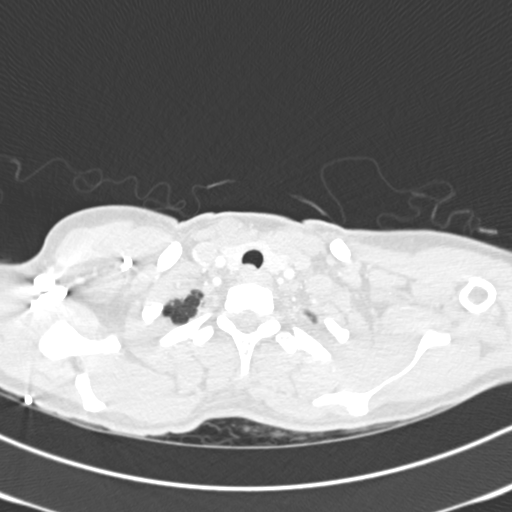

[Series 8: coronals · coronal · 0.51mm/px · 3 of 80 slices shown]
[im 20/80  soft-tissue]
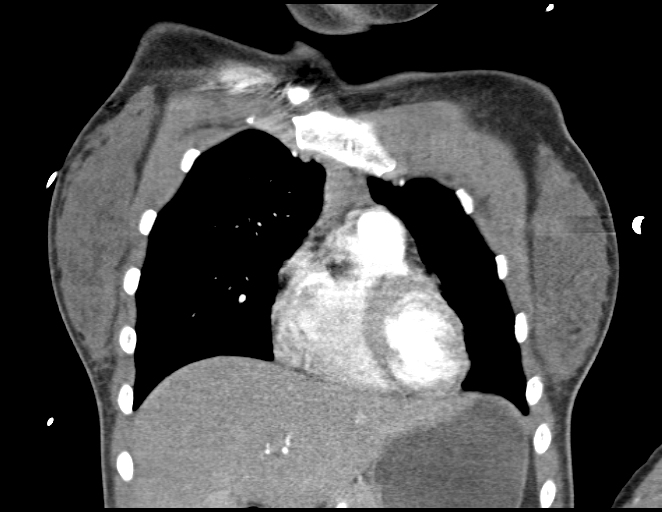
[im 40/80  soft-tissue]
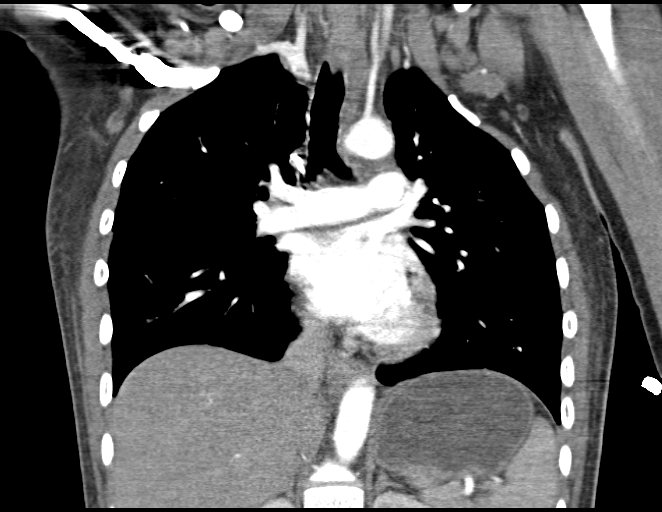
[im 60/80  soft-tissue]
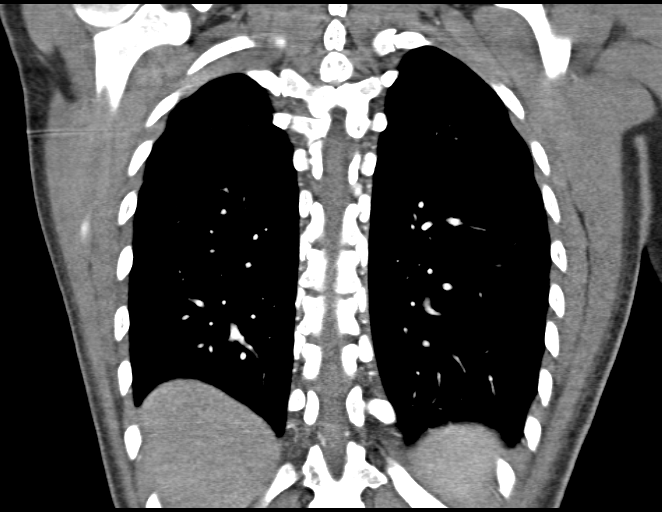

[18 of 46 positions shown; findings below may reference images not displayed]

FINDINGS: There is no pleural effusion identified.  No airspace consolidation
or atelectasis identified.

The trachea is patent and appears midline.  No endobronchial lesion
identified.  Normal heart size.  No pericardial effusion.  There is
no enlarged mediastinal or hilar lymph nodes.  The main pulmonary
artery is patent.  No abnormal filling defects within the lobar or
segmental pulmonary arteries to suggest an acute pulmonary embolus.

Review of the visualized osseous structures is unremarkable.  No
worrisome lytic or sclerotic bone lesions identified.

Limited imaging through the upper abdomen is unremarkable.
IMPRESSION: 1.  Negative for acute pulmonary embolus.

## 2014-08-24 ENCOUNTER — Other Ambulatory Visit: Payer: Self-pay | Admitting: Nurse Practitioner

## 2014-08-25 NOTE — Telephone Encounter (Signed)
Medication refill request: Norgestimate-Eth Estradiol  Last AEX:  09/02/13 with PG  Next AEX: No Aex scheduled  Last MMG (if hormonal medication request): n/a Refill authorized: ?   Left Message To Call Back

## 2014-08-30 NOTE — Telephone Encounter (Signed)
Left Message To Call Back  

## 2014-08-30 NOTE — Telephone Encounter (Signed)
Called patient x2 no callback- rx Denied.

## 2014-09-15 ENCOUNTER — Other Ambulatory Visit: Payer: Self-pay | Admitting: Nurse Practitioner

## 2014-09-15 NOTE — Telephone Encounter (Signed)
Medication refill request: Norgestimate-Ethinyl Estradiol Triphasic Last AEX:  09/03/14 with PG  Next AEX: 11/24/14 with PG  Last MMG (if hormonal medication request): n/a Refill authorized: #[id number] rfs, please advise.

## 2014-11-24 ENCOUNTER — Encounter: Payer: Self-pay | Admitting: Nurse Practitioner

## 2014-11-24 ENCOUNTER — Ambulatory Visit (INDEPENDENT_AMBULATORY_CARE_PROVIDER_SITE_OTHER): Payer: BLUE CROSS/BLUE SHIELD | Admitting: Nurse Practitioner

## 2014-11-24 VITALS — BP 100/80 | HR 72 | Ht 62.75 in | Wt 129.0 lb

## 2014-11-24 DIAGNOSIS — Z0000 Encounter for general adult medical examination without abnormal findings: Secondary | ICD-10-CM | POA: Diagnosis not present

## 2014-11-24 DIAGNOSIS — R319 Hematuria, unspecified: Secondary | ICD-10-CM | POA: Diagnosis not present

## 2014-11-24 DIAGNOSIS — Z01419 Encounter for gynecological examination (general) (routine) without abnormal findings: Secondary | ICD-10-CM

## 2014-11-24 DIAGNOSIS — Z3041 Encounter for surveillance of contraceptive pills: Secondary | ICD-10-CM

## 2014-11-24 MED ORDER — DROSPIRENONE-ETHINYL ESTRADIOL 3-0.02 MG PO TABS: 1.0000 | ORAL_TABLET | Freq: Every day | ORAL | Status: DC
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
## 2014-11-25 LAB — URINALYSIS, MICROSCOPIC ONLY
Bacteria, UA: NONE SEEN [HPF]
CASTS: NONE SEEN [LPF]
CRYSTALS: NONE SEEN [HPF]
YEAST: NONE SEEN [HPF]

## 2014-11-26 LAB — URINE CULTURE
Colony Count: NO GROWTH
Organism ID, Bacteria: NO GROWTH

## 2014-11-28 NOTE — Progress Notes (Signed)
Encounter reviewed by Dr. Teller Wakefield Amundson C. Silva.  

## 2015-03-04 ENCOUNTER — Other Ambulatory Visit: Payer: Self-pay | Admitting: Nurse Practitioner

## 2015-03-04 ENCOUNTER — Telehealth: Payer: Self-pay | Admitting: Nurse Practitioner

## 2015-03-04 MED ORDER — DROSPIRENONE-ETHINYL ESTRADIOL 3-0.03 MG PO TABS: 1.0000 | ORAL_TABLET | Freq: Every day | ORAL | Status: DC

## 2015-03-04 NOTE — Telephone Encounter (Signed)
Yes OK to return to Yasmin.  Order is placed.

## 2015-03-04 NOTE — Telephone Encounter (Signed)
Spoke with patient. Patient states at her aex she was placed on Yaz. Was told to provide an update on how she was doing after three months. Patient states she has been experiencing migraines around the time of her cycle every month with the Yaz. Was on Yasmin in the past with no side effects. Would like to try Yasmin again for 3 months to see how she does. Patient is due to start a new pack this weekend. Advised I will speak with Ria CommentPatricia Grubb, FNP and return call with further recommendations.   Ria CommentPatricia Grubb, FNP okay to send in rx for Yasmin #3 0RF?

## 2015-03-04 NOTE — Telephone Encounter (Signed)
Patient was started on Yaz by Ms. Patty and she was told to give us a update in 3 months. Patient would like to try Yasmin or generic Ocella because the Dianah FieldYaz has been giving her migraines. Patient would like [facility] and she will give Ms. Patty a update in 3 months. She said she needs to start this this weekend. Best # to reach patient [phone]

## 2015-03-07 NOTE — Telephone Encounter (Signed)
Left message to call Kaitlyn at [phone]. 

## 2015-03-10 NOTE — Telephone Encounter (Signed)
Left message to call Kaitlyn at [phone]. 

## 2015-03-10 NOTE — Telephone Encounter (Signed)
Spoke with patient. Advised of message as seen below from Ria CommentPatricia Grubb, FNP. Patient states she picked up rx for Yasmin last weekend and started new pack when she was due. Will call to provide an update in 3 months with how she is doing with Yasmin.  Routing to provider for final review. Patient agreeable to disposition. Will close encounter.

## 2015-10-20 DIAGNOSIS — A740 Chlamydial conjunctivitis: Secondary | ICD-10-CM | POA: Diagnosis not present

## 2015-10-20 DIAGNOSIS — H5213 Myopia, bilateral: Secondary | ICD-10-CM | POA: Diagnosis not present

## 2015-11-24 DIAGNOSIS — H0520 Unspecified exophthalmos: Secondary | ICD-10-CM | POA: Diagnosis not present

## 2015-11-24 DIAGNOSIS — Z135 Encounter for screening for eye and ear disorders: Secondary | ICD-10-CM | POA: Diagnosis not present

## 2015-12-26 DIAGNOSIS — G43719 Chronic migraine without aura, intractable, without status migrainosus: Secondary | ICD-10-CM | POA: Diagnosis not present

## 2015-12-26 DIAGNOSIS — G43019 Migraine without aura, intractable, without status migrainosus: Secondary | ICD-10-CM | POA: Diagnosis not present

## 2015-12-26 DIAGNOSIS — G43839 Menstrual migraine, intractable, without status migrainosus: Secondary | ICD-10-CM | POA: Diagnosis not present

## 2016-02-14 ENCOUNTER — Other Ambulatory Visit: Payer: Self-pay | Admitting: Nurse Practitioner

## 2016-02-14 NOTE — Telephone Encounter (Signed)
Medication refill request: Theresa Lawrence Last AEX:  11/24/14 PG Next AEX: 03/15/16 Last MMG (if hormonal medication request): n/a Refill authorized: 03/04/15 #3packs w/3 refills; today please advise

## 2016-03-15 ENCOUNTER — Ambulatory Visit: Payer: BLUE CROSS/BLUE SHIELD | Admitting: Nurse Practitioner

## 2016-06-11 ENCOUNTER — Encounter: Payer: Self-pay | Admitting: Nurse Practitioner

## 2016-06-11 ENCOUNTER — Ambulatory Visit (INDEPENDENT_AMBULATORY_CARE_PROVIDER_SITE_OTHER): Payer: BLUE CROSS/BLUE SHIELD | Admitting: Nurse Practitioner

## 2016-06-11 VITALS — BP 100/66 | HR 84 | Temp 98.3°F | Ht 62.75 in | Wt 127.0 lb

## 2016-06-11 DIAGNOSIS — R350 Frequency of micturition: Secondary | ICD-10-CM

## 2016-06-11 DIAGNOSIS — N390 Urinary tract infection, site not specified: Secondary | ICD-10-CM

## 2016-06-11 DIAGNOSIS — R319 Hematuria, unspecified: Secondary | ICD-10-CM | POA: Diagnosis not present

## 2016-06-11 DIAGNOSIS — R300 Dysuria: Secondary | ICD-10-CM

## 2016-06-11 LAB — POCT URINALYSIS DIPSTICK
BILIRUBIN UA: NEGATIVE
GLUCOSE UA: NEGATIVE
KETONES UA: NEGATIVE
Nitrite, UA: POSITIVE
Protein, UA: NEGATIVE
Urobilinogen, UA: NEGATIVE (ref ?–2.0)
pH, UA: 6.5 (ref 5.0–8.0)

## 2016-06-11 MED ORDER — DROSPIRENONE-ETHINYL ESTRADIOL 3-0.03 MG PO TABS: 1.0000 | ORAL_TABLET | Freq: Every day | ORAL | 0 refills | Status: DC

## 2016-06-11 MED ORDER — NITROFURANTOIN MONOHYD MACRO 100 MG PO CAPS: 100.0000 mg | ORAL_CAPSULE | Freq: Two times a day (BID) | ORAL | 0 refills | Status: DC

## 2016-06-11 NOTE — Progress Notes (Signed)
Patient ID: Leandro ReasonerKeri L Lawrence, female   DOB: 29-Oct-1985, 31 y.o.   MRN: [id number]  31 y.o.Married Caucasian female G2P0020 here with complaint of UTI, with onset  on Friday. Patient complaining of:  dysuria, urinary frequency, urinary urgency, abdominal pain and cloudy malordorous urine. Patient denies fever, chills, nausea or back pain. No new personal products. Patient feels possible related to sexual activity. Last SA activity 3/12.  Denies vaginal symptoms.    Contraception is currently condoms this past month since ran out of OCP.  Patient has adequate water intake.  O: Healthy female WDWN Affect: Normal, orientation x 3 Skin : warm and dry CVAT: negative bilateral Abdomen: positive for suprapubic tenderness  Pelvic exam:  Not indicated POCT:  Pos nitrate, large RBC, 1 + WBC ph 6.5  A:  R/O UTI  P: Reviewed findings of UTI and need for treatment. Rx:  Macrobid 100 mg BID  Refill on OCP for a month until new AEX can be done in 2 weeks. (menses is due nex week). Advised BUM. WUJ:WJXBJLab:Urine micro, culture Reviewed warning signs and symptoms of UTI and need to advise if occurring. Encouraged to limit soda, tea, and coffee   RV prn

## 2016-06-11 NOTE — Patient Instructions (Signed)

## 2016-06-12 LAB — URINALYSIS, MICROSCOPIC ONLY
CASTS: NONE SEEN [LPF]
Crystals: NONE SEEN [HPF]
WBC, UA: >60 WBC/HPF — AB (ref <=5)
Yeast: NONE SEEN [HPF]

## 2016-06-14 ENCOUNTER — Telehealth: Payer: Self-pay

## 2016-06-14 LAB — URINE CULTURE

## 2016-06-14 NOTE — Telephone Encounter (Signed)
-----   Message from Verner Choleborah S Leonard, CNM sent at 06/14/2016  4:37 PM EDT ----- Notify patient that Urine culture was positive for E.Coli and your medication is appropriate for this infection. Complete as directed. Call if does not resolve after completion.

## 2016-06-14 NOTE — Progress Notes (Signed)
Encounter reviewed by Dr. Maryfrances Portugal Amundson C. Silva.  

## 2016-06-14 NOTE — Telephone Encounter (Signed)
Left message for patient to call Summer back.  

## 2016-06-15 NOTE — Telephone Encounter (Signed)
Patient informed of urine culture results. Patient verbalized understanding and agreement.

## 2016-06-20 ENCOUNTER — Telehealth: Payer: Self-pay | Admitting: Nurse Practitioner

## 2016-06-20 ENCOUNTER — Encounter: Payer: Self-pay | Admitting: Nurse Practitioner

## 2016-06-20 ENCOUNTER — Ambulatory Visit: Payer: BLUE CROSS/BLUE SHIELD | Admitting: Nurse Practitioner
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
## 2016-06-20 NOTE — Telephone Encounter (Signed)
Patient called and cancelled her appointment for her AEX today due to "work." She declined other appointments today and rescheduled to 07/18/16. FYI only.

## 2016-07-18 ENCOUNTER — Encounter: Payer: Self-pay | Admitting: Nurse Practitioner

## 2016-07-18 ENCOUNTER — Ambulatory Visit (INDEPENDENT_AMBULATORY_CARE_PROVIDER_SITE_OTHER): Payer: BLUE CROSS/BLUE SHIELD | Admitting: Nurse Practitioner

## 2016-07-18 VITALS — BP 110/68 | HR 84 | Temp 97.9°F | Resp 16 | Ht 62.25 in | Wt 126.8 lb

## 2016-07-18 DIAGNOSIS — Z0000 Encounter for general adult medical examination without abnormal findings: Secondary | ICD-10-CM | POA: Diagnosis not present

## 2016-07-18 DIAGNOSIS — R238 Other skin changes: Secondary | ICD-10-CM | POA: Diagnosis not present

## 2016-07-18 DIAGNOSIS — Z23 Encounter for immunization: Secondary | ICD-10-CM | POA: Diagnosis not present

## 2016-07-18 DIAGNOSIS — Z01419 Encounter for gynecological examination (general) (routine) without abnormal findings: Secondary | ICD-10-CM

## 2016-07-18 DIAGNOSIS — R233 Spontaneous ecchymoses: Secondary | ICD-10-CM

## 2016-07-18 DIAGNOSIS — R8290 Unspecified abnormal findings in urine: Secondary | ICD-10-CM | POA: Diagnosis not present

## 2016-07-18 DIAGNOSIS — Z1151 Encounter for screening for human papillomavirus (HPV): Secondary | ICD-10-CM | POA: Diagnosis not present

## 2016-07-18 DIAGNOSIS — L659 Nonscarring hair loss, unspecified: Secondary | ICD-10-CM

## 2016-07-18 DIAGNOSIS — Z124 Encounter for screening for malignant neoplasm of cervix: Secondary | ICD-10-CM | POA: Diagnosis not present

## 2016-07-18 LAB — COMPREHENSIVE METABOLIC PANEL
ALBUMIN: 4.2 g/dL (ref 3.6–5.1)
ALK PHOS: 81 U/L (ref 33–115)
ALT: 10 U/L (ref 6–29)
AST: 18 U/L (ref 10–30)
BUN: 14 mg/dL (ref 7–25)
CALCIUM: 9.3 mg/dL (ref 8.6–10.2)
CO2: 23 mmol/L (ref 20–31)
Chloride: 102 mmol/L (ref 98–110)
Creat: 0.89 mg/dL (ref 0.50–1.10)
Glucose, Bld: 84 mg/dL (ref 65–99)
POTASSIUM: 4 mmol/L (ref 3.5–5.3)
Sodium: 138 mmol/L (ref 135–146)
TOTAL PROTEIN: 7 g/dL (ref 6.1–8.1)
Total Bilirubin: 0.4 mg/dL (ref 0.2–1.2)

## 2016-07-18 LAB — CBC WITH DIFFERENTIAL/PLATELET
Basophils Absolute: 72 cells/uL (ref 0–200)
Basophils Relative: 1 %
EOS ABS: 432 {cells}/uL (ref 15–500)
Eosinophils Relative: 6 %
HEMATOCRIT: 38.2 % (ref 35.0–45.0)
Hemoglobin: 12.5 g/dL (ref 11.7–15.5)
LYMPHS PCT: 34 %
Lymphs Abs: 2448 cells/uL (ref 850–3900)
MCH: 28.5 pg (ref 27.0–33.0)
MCHC: 32.7 g/dL (ref 32.0–36.0)
MCV: 87.2 fL (ref 80.0–100.0)
MONO ABS: 504 {cells}/uL (ref 200–950)
MONOS PCT: 7 %
MPV: 9.1 fL (ref 7.5–12.5)
NEUTROS PCT: 52 %
Neutro Abs: 3744 cells/uL (ref 1500–7800)
PLATELETS: 332 10*3/uL (ref 140–400)
RBC: 4.38 MIL/uL (ref 3.80–5.10)
RDW: 13.7 % (ref 11.0–15.0)
WBC: 7.2 10*3/uL (ref 3.8–10.8)

## 2016-07-18 LAB — LIPID PANEL
CHOL/HDL RATIO: 2.9 ratio (ref <5.0)
Cholesterol: 182 mg/dL (ref <200)
HDL: 63 mg/dL (ref >50)
LDL CALC: 107 mg/dL — AB (ref <100)
Triglycerides: 59 mg/dL (ref <150)
VLDL: 12 mg/dL (ref <30)

## 2016-07-18 LAB — TSH: TSH: 1.31 mIU/L

## 2016-07-18 LAB — POCT URINALYSIS DIPSTICK
Bilirubin, UA: NEGATIVE
Glucose, UA: NEGATIVE
KETONES UA: NEGATIVE
Nitrite, UA: POSITIVE
PROTEIN UA: NEGATIVE
Urobilinogen, UA: 0.2 E.U./dL
pH, UA: 5 (ref 5.0–8.0)

## 2016-07-18 NOTE — Patient Instructions (Signed)
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
## 2016-07-19 LAB — URINALYSIS, MICROSCOPIC ONLY
Casts: NONE SEEN [LPF]
Crystals: NONE SEEN [HPF]
RBC / HPF: NONE SEEN RBC/HPF (ref <=2)
SQUAMOUS EPITHELIAL / LPF: NONE SEEN [HPF] (ref <=5)
WBC UA: NONE SEEN WBC/HPF (ref <=5)
Yeast: NONE SEEN [HPF]

## 2016-07-19 LAB — WET PREP BY MOLECULAR PROBE
Candida species: NOT DETECTED
GARDNERELLA VAGINALIS: DETECTED — AB
Trichomonas vaginosis: NOT DETECTED

## 2016-07-19 LAB — HEMOGLOBIN A1C
Hgb A1c MFr Bld: 5.1 % (ref <5.7)
Mean Plasma Glucose: 100 mg/dL

## 2016-07-19 LAB — VITAMIN D 25 HYDROXY (VIT D DEFICIENCY, FRACTURES): VIT D 25 HYDROXY: 45 ng/mL (ref 30–100)

## 2016-07-19 MED ORDER — METRONIDAZOLE 0.75 % VA GEL: 1.0000 | Freq: Two times a day (BID) | VAGINAL | 0 refills | Status: DC

## 2016-07-19 NOTE — Progress Notes (Signed)
Encounter reviewed by Dr. Elkin Belfield Amundson C. Silva.  

## 2016-07-20 ENCOUNTER — Other Ambulatory Visit: Payer: Self-pay | Admitting: Nurse Practitioner

## 2016-07-20 LAB — URINE CULTURE

## 2016-07-20 LAB — IPS PAP TEST WITH HPV

## 2016-07-20 MED ORDER — CIPROFLOXACIN HCL 500 MG PO TABS: 500.0000 mg | ORAL_TABLET | Freq: Two times a day (BID) | ORAL | 0 refills | Status: DC

## 2016-07-20 NOTE — Progress Notes (Signed)
See result note.  

## 2016-07-23 ENCOUNTER — Telehealth: Payer: Self-pay

## 2016-07-23 NOTE — Telephone Encounter (Addendum)
Left message to call Kaitlyn at [phone].  Notes recorded by Ria Comment, FNP on 07/23/2016 at 8:21 AM EDT Please let pt know that urine culture was positive for E Coli and Cipro is a good choice for her. Since she has had 2 infection close together I would like for her to return for [facility] - nurse visit in about 10 days. The pap is normal but does have BV - already treated for that. Pap 02 recall. ------  Notes recorded by Ria Comment, FNP on 07/20/2016 at 4:19 PM EDT Please call pt - results of urine culture are not back but she has a lot of infection. I would like to start her on Cipro 500 mg BID # 14 - still pending final result.

## 2016-07-24 NOTE — Telephone Encounter (Signed)
Spoke with patient. Advised of message as seen below from Ria Comment, FNP. Patient verbalizes understanding. States she picked up Cipro today and has started the medication. Urine recheck scheduled for 08/06/2016 at 8:30 am. Patient is agreeable to date and time. 02 recall placed.  Routing to provider for final review. Patient agreeable to disposition. Will close encounter.

## 2016-07-30 DIAGNOSIS — G43839 Menstrual migraine, intractable, without status migrainosus: Secondary | ICD-10-CM | POA: Diagnosis not present

## 2016-07-30 DIAGNOSIS — G43019 Migraine without aura, intractable, without status migrainosus: Secondary | ICD-10-CM | POA: Diagnosis not present

## 2016-07-30 DIAGNOSIS — G43719 Chronic migraine without aura, intractable, without status migrainosus: Secondary | ICD-10-CM | POA: Diagnosis not present

## 2016-08-06 ENCOUNTER — Ambulatory Visit (INDEPENDENT_AMBULATORY_CARE_PROVIDER_SITE_OTHER): Payer: BLUE CROSS/BLUE SHIELD

## 2016-08-06 VITALS — BP 112/70 | HR 72 | Resp 16 | Ht 62.25 in | Wt 127.0 lb

## 2016-08-06 DIAGNOSIS — R8290 Unspecified abnormal findings in urine: Secondary | ICD-10-CM | POA: Diagnosis not present

## 2016-08-06 NOTE — Progress Notes (Signed)
Patient here for urine culture recheck. On 07/18/16, patient diagnosed with e coli. Started Cipro as directed.   Patient feels better today.  Routed to provider and encounter closed.

## 2016-08-08 ENCOUNTER — Telehealth: Payer: Self-pay

## 2016-08-08 LAB — URINE CULTURE

## 2016-08-08 NOTE — Telephone Encounter (Signed)
Left message for patient to call Summer back.  

## 2016-08-08 NOTE — Telephone Encounter (Signed)
-----   Message from Ria CommentPatricia Grubb, FNP sent at 08/08/2016  4:20 PM EDT ----- Please let pt know that urine culture is still positive for infection!.  She has been on Macrobid and changed to Cipro.  Now needs to take Keflex 500 mg QID # 28.  (Allergic to Sulfa).  Needs another TOC in 10 days.

## 2016-08-09 MED ORDER — CEPHALEXIN 500 MG PO CAPS: 500.0000 mg | ORAL_CAPSULE | Freq: Four times a day (QID) | ORAL | 0 refills | Status: DC

## 2016-08-09 NOTE — Telephone Encounter (Signed)
Per DPR, left detailed message on voicemail. Prescription for Keflex was sent to pharmacy on file. Patient advised to call office to schedule TOC in 10 days.

## 2016-08-21 ENCOUNTER — Telehealth: Payer: Self-pay

## 2016-08-21 NOTE — Telephone Encounter (Signed)
-----   Message from Ria CommentPatricia Grubb, FNP sent at 08/08/2016  4:20 PM EDT ----- Please let pt know that urine culture is still positive for infection!.  She has been on Macrobid and changed to Cipro.  Now needs to take Keflex 500 mg QID # 28.  (Allergic to Sulfa).  Needs another TOC in 10 days.

## 2016-08-21 NOTE — Telephone Encounter (Signed)
Per DPR, left message for patient to call back and schedule TOC appointment. Also, see lab result for other messages.

## 2016-08-22 NOTE — Telephone Encounter (Signed)
See Lab encounter. Per Shirlyn GoltzPatty Grubb, FNP to close encounter.

## 2016-12-20 DIAGNOSIS — H5213 Myopia, bilateral: Secondary | ICD-10-CM | POA: Diagnosis not present

## 2017-02-15 DIAGNOSIS — Z23 Encounter for immunization: Secondary | ICD-10-CM | POA: Diagnosis not present

## 2017-03-25 DIAGNOSIS — G43719 Chronic migraine without aura, intractable, without status migrainosus: Secondary | ICD-10-CM | POA: Diagnosis not present

## 2017-03-25 DIAGNOSIS — G43019 Migraine without aura, intractable, without status migrainosus: Secondary | ICD-10-CM | POA: Diagnosis not present

## 2017-03-25 DIAGNOSIS — G43839 Menstrual migraine, intractable, without status migrainosus: Secondary | ICD-10-CM | POA: Diagnosis not present

## 2017-11-13 DIAGNOSIS — G43719 Chronic migraine without aura, intractable, without status migrainosus: Secondary | ICD-10-CM | POA: Diagnosis not present

## 2017-11-13 DIAGNOSIS — G43019 Migraine without aura, intractable, without status migrainosus: Secondary | ICD-10-CM | POA: Diagnosis not present

## 2017-11-13 DIAGNOSIS — G43839 Menstrual migraine, intractable, without status migrainosus: Secondary | ICD-10-CM | POA: Diagnosis not present

## 2018-04-22 DIAGNOSIS — H5213 Myopia, bilateral: Secondary | ICD-10-CM | POA: Diagnosis not present

## 2019-04-14 DIAGNOSIS — Z20828 Contact with and (suspected) exposure to other viral communicable diseases: Secondary | ICD-10-CM | POA: Diagnosis not present

## 2019-04-20 DIAGNOSIS — Z20828 Contact with and (suspected) exposure to other viral communicable diseases: Secondary | ICD-10-CM | POA: Diagnosis not present

## 2019-06-07 DIAGNOSIS — J019 Acute sinusitis, unspecified: Secondary | ICD-10-CM | POA: Diagnosis not present

## 2019-06-07 DIAGNOSIS — J209 Acute bronchitis, unspecified: Secondary | ICD-10-CM | POA: Diagnosis not present

## 2019-06-25 DIAGNOSIS — Z8616 Personal history of COVID-19: Secondary | ICD-10-CM

## 2019-06-25 HISTORY — DX: Personal history of COVID-19: Z86.16

## 2019-07-11 DIAGNOSIS — Z03818 Encounter for observation for suspected exposure to other biological agents ruled out: Secondary | ICD-10-CM | POA: Diagnosis not present

## 2019-07-11 DIAGNOSIS — Z20828 Contact with and (suspected) exposure to other viral communicable diseases: Secondary | ICD-10-CM | POA: Diagnosis not present

## 2019-07-13 DIAGNOSIS — Z03818 Encounter for observation for suspected exposure to other biological agents ruled out: Secondary | ICD-10-CM | POA: Diagnosis not present

## 2019-07-13 DIAGNOSIS — Z20828 Contact with and (suspected) exposure to other viral communicable diseases: Secondary | ICD-10-CM | POA: Diagnosis not present

## 2019-07-29 DIAGNOSIS — H5213 Myopia, bilateral: Secondary | ICD-10-CM | POA: Diagnosis not present
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
## 2019-11-11 ENCOUNTER — Encounter: Payer: Self-pay | Admitting: Obstetrics and Gynecology

## 2019-11-11 ENCOUNTER — Other Ambulatory Visit: Payer: Self-pay

## 2019-11-11 ENCOUNTER — Ambulatory Visit (INDEPENDENT_AMBULATORY_CARE_PROVIDER_SITE_OTHER): Payer: BC Managed Care – PPO | Admitting: Obstetrics and Gynecology

## 2019-11-11 VITALS — BP 100/64 | HR 70 | Resp 22 | Ht 62.25 in | Wt 116.0 lb

## 2019-11-11 DIAGNOSIS — R319 Hematuria, unspecified: Secondary | ICD-10-CM

## 2019-11-11 DIAGNOSIS — R000 Tachycardia, unspecified: Secondary | ICD-10-CM

## 2019-11-11 DIAGNOSIS — Z01419 Encounter for gynecological examination (general) (routine) without abnormal findings: Secondary | ICD-10-CM

## 2019-11-11 LAB — POCT URINALYSIS DIPSTICK
Bilirubin, UA: NEGATIVE
Glucose, UA: NEGATIVE
Ketones, UA: NEGATIVE
Leukocytes, UA: NEGATIVE
Nitrite, UA: NEGATIVE
Protein, UA: NEGATIVE
Urobilinogen, UA: 0.2 E.U./dL
pH, UA: 5 (ref 5.0–8.0)

## 2019-11-11 NOTE — Patient Instructions (Signed)

## 2019-11-12 LAB — LIPID PANEL
Chol/HDL Ratio: 2.9 ratio (ref 0.0–4.4)
Cholesterol, Total: 191 mg/dL (ref 100–199)
HDL: 67 mg/dL (ref >39)
LDL Chol Calc (NIH): 115 mg/dL — ABNORMAL HIGH (ref 0–99)
Triglycerides: 46 mg/dL (ref 0–149)
VLDL Cholesterol Cal: 9 mg/dL (ref 5–40)

## 2019-11-12 LAB — COMPREHENSIVE METABOLIC PANEL
ALT: 11 IU/L (ref 0–32)
AST: 15 IU/L (ref 0–40)
Albumin/Globulin Ratio: 2.1 (ref 1.2–2.2)
Albumin: 4.7 g/dL (ref 3.8–4.8)
Alkaline Phosphatase: 68 IU/L (ref 48–121)
BUN/Creatinine Ratio: 24 — ABNORMAL HIGH (ref 9–23)
BUN: 19 mg/dL (ref 6–20)
Bilirubin Total: 0.2 mg/dL (ref 0.0–1.2)
CO2: 25 mmol/L (ref 20–29)
Calcium: 9.3 mg/dL (ref 8.7–10.2)
Chloride: 102 mmol/L (ref 96–106)
Creatinine, Ser: 0.79 mg/dL (ref 0.57–1.00)
GFR calc Af Amer: 114 mL/min/{1.73_m2} (ref >59)
GFR calc non Af Amer: 99 mL/min/{1.73_m2} (ref >59)
Globulin, Total: 2.2 g/dL (ref 1.5–4.5)
Glucose: 87 mg/dL (ref 65–99)
Potassium: 4 mmol/L (ref 3.5–5.2)
Sodium: 140 mmol/L (ref 134–144)
Total Protein: 6.9 g/dL (ref 6.0–8.5)

## 2019-11-12 LAB — CBC
Hematocrit: 37.6 % (ref 34.0–46.6)
Hemoglobin: 12.6 g/dL (ref 11.1–15.9)
MCH: 29 pg (ref 26.6–33.0)
MCHC: 33.5 g/dL (ref 31.5–35.7)
MCV: 87 fL (ref 79–97)
Platelets: 300 10*3/uL (ref 150–450)
RBC: 4.34 x10E6/uL (ref 3.77–5.28)
RDW: 11.9 % (ref 11.7–15.4)
WBC: 7.7 10*3/uL (ref 3.4–10.8)

## 2019-11-12 LAB — URINALYSIS, MICROSCOPIC ONLY
Bacteria, UA: NONE SEEN
Casts: NONE SEEN /lpf
RBC, Urine: NONE SEEN /hpf (ref 0–2)
WBC, UA: NONE SEEN /hpf (ref 0–5)

## 2019-11-12 LAB — TSH: TSH: 1.33 u[IU]/mL (ref 0.450–4.500)

## 2019-11-13 LAB — URINE CULTURE: Organism ID, Bacteria: NO GROWTH

## 2019-12-26 ENCOUNTER — Encounter: Payer: Self-pay | Admitting: Obstetrics and Gynecology

## 2019-12-28 ENCOUNTER — Emergency Department (INDEPENDENT_AMBULATORY_CARE_PROVIDER_SITE_OTHER)
Admission: EM | Admit: 2019-12-28 | Discharge: 2019-12-28 | Disposition: A | Discharge status: Home / Self Care | Payer: BC Managed Care – PPO | Source: Home / Self Care

## 2019-12-28 ENCOUNTER — Emergency Department: Admit: 2019-12-28 | Discharge status: Absent without leave | Payer: Self-pay

## 2019-12-28 ENCOUNTER — Emergency Department: Payer: BC Managed Care – PPO

## 2019-12-28 ENCOUNTER — Other Ambulatory Visit: Payer: Self-pay

## 2019-12-28 ENCOUNTER — Telehealth: Payer: Self-pay

## 2019-12-28 DIAGNOSIS — M79661 Pain in right lower leg: Secondary | ICD-10-CM

## 2019-12-28 NOTE — ED Triage Notes (Addendum)
Pt presents to Urgent Care with c/o pain to R calf x approx 1 week. States pain began a few days after getting her 2nd Moderna vaccine. Reports constant ache w/ burning sensations at times; affecting her ability to walk. No redness or swelling noted. Pt states she has noticed that her R foot is sometimes colder than her L foot.

## 2019-12-28 NOTE — Telephone Encounter (Signed)
AEX 11/11/2019 H/o episodes of tachycardia, referral to cardiologist on 01/07/20. Bicuspid aortic valve Off OCPs x 2 years, pregnancy trying x 3 months  Spoke with pt. Pt states having painful, achy, burning sensation and unable to bear weight  in right calf x 3 days past last Covid vaccine on 12/18/19.  Pt denies swelling, warmth at site, redness, fever, chills.  Pt states does not currently have PCP. Would like recommendations. Recommendations given to pt near [place] per pt's request. Pt verbalized understanding and will call for new pt appt.  Pt advised to be seen at Urgent Care today for right calf sx. Pt agreeable. Denies going to ER.    Advised pt  will review with Dr Edward Jolly for further recommendations and advice and return call to pt. Pt agreeable.   Routing to Dr Edward Jolly.

## 2019-12-28 NOTE — Telephone Encounter (Signed)
Patient sent the following message via MyChart.  I had my second moderna Covid shot September 24th. Since then I've noticed pain in my right calf muscle, it constantly aches, has a little bit of a burning sensation to it, feels like something is in my calf and I have trouble putting all my weight on that leg without it trying to give out but it's not swollen. I'm not sure if the two have anything to do with one another or if it's just coincidence. Would there be a possibility that I have a blood clot? I don't have a primary doctor  to ask so I wanted your opinion. Please advise me on the next step to do I really don't want to go to the hospital with Covid on the ramp. My phone number is [phone] if you need to call me.   Thanks for your help, Theresa Lawrence

## 2019-12-28 NOTE — Discharge Instructions (Signed)
  You may take 500mg  acetaminophen every 4-6 hours or in combination with ibuprofen 400-600mg  every 6-8 hours as needed for pain and inflammation.  Follow up with family medicine in 1-2 weeks as needed.

## 2019-12-28 NOTE — Telephone Encounter (Signed)
Spoke with pt. Pt given update from Dr Edward Jolly. Pt agreeable to go to UC or ER to be seen.  Encounter closed.
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
## 2019-12-28 NOTE — Telephone Encounter (Signed)
I agree she needs evaluation, either urgent care or ER.

## 2020-01-07 ENCOUNTER — Ambulatory Visit: Payer: BC Managed Care – PPO | Admitting: Internal Medicine

## 2020-01-07 ENCOUNTER — Other Ambulatory Visit: Payer: Self-pay

## 2020-01-07 VITALS — BP 118/84 | HR 86 | Ht 63.0 in | Wt 114.8 lb

## 2020-01-07 DIAGNOSIS — R000 Tachycardia, unspecified: Secondary | ICD-10-CM
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
## 2020-01-07 NOTE — Patient Instructions (Signed)
Medication Instructions:  No changes *If you need a refill on your cardiac medications before your next appointment, please call your pharmacy*   Lab Work: none  Testing/Procedures: none   Follow-Up: Your physician recommends that you schedule a follow-up appointment as needed with Dr. Tenny Craw.  Other Instructions

## 2020-01-08 ENCOUNTER — Ambulatory Visit (INDEPENDENT_AMBULATORY_CARE_PROVIDER_SITE_OTHER): Payer: BC Managed Care – PPO | Admitting: Medical-Surgical

## 2020-01-08 ENCOUNTER — Encounter: Payer: Self-pay | Admitting: Medical-Surgical

## 2020-01-08 VITALS — BP 115/80 | HR 72 | Temp 98.2°F | Ht 63.0 in | Wt 115.8 lb

## 2020-01-08 DIAGNOSIS — Z1159 Encounter for screening for other viral diseases: Secondary | ICD-10-CM

## 2020-01-08 DIAGNOSIS — R634 Abnormal weight loss: Secondary | ICD-10-CM | POA: Diagnosis not present

## 2020-01-08 DIAGNOSIS — L659 Nonscarring hair loss, unspecified: Secondary | ICD-10-CM | POA: Diagnosis not present

## 2020-01-08 DIAGNOSIS — G43909 Migraine, unspecified, not intractable, without status migrainosus: Secondary | ICD-10-CM

## 2020-01-08 DIAGNOSIS — Z7689 Persons encountering health services in other specified circumstances: Secondary | ICD-10-CM | POA: Diagnosis not present
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
## 2020-01-11 LAB — THYROID PANEL WITH TSH
Free Thyroxine Index: 2.4 (ref 1.4–3.8)
T3 Uptake: 30 % (ref 22–35)
T4, Total: 8 ug/dL (ref 5.1–11.9)
TSH: 0.9 mIU/L

## 2020-01-11 LAB — SPECIMEN COMPROMISED

## 2020-01-11 LAB — HEPATITIS C ANTIBODY
Hepatitis C Ab: NONREACTIVE
SIGNAL TO CUT-OFF: 0.02 (ref <1.00)

## 2020-01-11 LAB — THYROID PEROXIDASE ANTIBODY: Thyroperoxidase Ab SerPl-aCnc: <1 IU/mL (ref <9)

## 2020-08-05 ENCOUNTER — Telehealth: Payer: Self-pay | Admitting: Urology

## 2020-08-05 MED ORDER — AZITHROMYCIN 250 MG PO TABS: ORAL_TABLET | ORAL | 0 refills | Status: DC

## 2020-08-05 NOTE — Telephone Encounter (Signed)
I spoke with patient by phone this morning  Regarding new onset of symptoms with productive cough and fever of 100.40F orally. She is coughing up green mucous which is present in the morning and throughout the day. She took an at home COVID test that was negative and has not had any ill contacts to her knowledge. She denies hemoptysis, shortness of breath, dyspnea, chills or night sweats.  She has had some body aches beginning this morning that she associates with the fever as they improve with Motrin/Tylenol. She is unable to get in to see her PCP today and has a very busy upcoming weekend so she has requested an antibiotic to treat the suspected bronchitis. She will use Mucinex for chest congestion and Delsym for cough suppressant. I am happy to send a Rx for Z-pak and she understands that if her symptoms worsen or do not improve despite treatment, she will need to seek medical evaluation. She is comfortable and in agreement with the stated plan.  Marguarite Arbour, MMS, PA-C [place]  [facility] Radiation Oncology Physician Assistant Direct Dial: [phone]  Fax: [phone]

## 2020-09-20 DIAGNOSIS — H5210 Myopia, unspecified eye: Secondary | ICD-10-CM | POA: Diagnosis not present

## 2021-06-01 DIAGNOSIS — Z113 Encounter for screening for infections with a predominantly sexual mode of transmission: Secondary | ICD-10-CM | POA: Diagnosis not present

## 2021-06-01 DIAGNOSIS — Z0000 Encounter for general adult medical examination without abnormal findings: Secondary | ICD-10-CM | POA: Diagnosis not present

## 2021-06-01 DIAGNOSIS — Z01419 Encounter for gynecological examination (general) (routine) without abnormal findings: Secondary | ICD-10-CM | POA: Diagnosis not present

## 2021-06-01 DIAGNOSIS — Z6821 Body mass index (BMI) 21.0-21.9, adult: Secondary | ICD-10-CM | POA: Diagnosis not present

## 2021-06-01 DIAGNOSIS — Z1329 Encounter for screening for other suspected endocrine disorder: Secondary | ICD-10-CM | POA: Diagnosis not present

## 2021-06-01 DIAGNOSIS — Z131 Encounter for screening for diabetes mellitus: Secondary | ICD-10-CM | POA: Diagnosis not present

## 2021-06-01 DIAGNOSIS — Z13220 Encounter for screening for lipoid disorders: Secondary | ICD-10-CM | POA: Diagnosis not present

## 2021-06-01 DIAGNOSIS — Z319 Encounter for procreative management, unspecified: Secondary | ICD-10-CM | POA: Diagnosis not present

## 2021-06-01 DIAGNOSIS — Z124 Encounter for screening for malignant neoplasm of cervix: Secondary | ICD-10-CM | POA: Diagnosis not present

## 2021-06-12 DIAGNOSIS — Z319 Encounter for procreative management, unspecified: Secondary | ICD-10-CM | POA: Diagnosis not present

## 2021-06-13 DIAGNOSIS — R9389 Abnormal findings on diagnostic imaging of other specified body structures: Secondary | ICD-10-CM | POA: Diagnosis not present

## 2021-06-13 DIAGNOSIS — Z3202 Encounter for pregnancy test, result negative: Secondary | ICD-10-CM | POA: Diagnosis not present

## 2021-07-13 DIAGNOSIS — N840 Polyp of corpus uteri: Secondary | ICD-10-CM | POA: Diagnosis not present

## 2021-07-19 DIAGNOSIS — R109 Unspecified abdominal pain: Secondary | ICD-10-CM | POA: Diagnosis not present

## 2021-07-19 DIAGNOSIS — R1032 Left lower quadrant pain: Secondary | ICD-10-CM | POA: Diagnosis not present

## 2021-08-17 DIAGNOSIS — Z319 Encounter for procreative management, unspecified: Secondary | ICD-10-CM | POA: Diagnosis not present

## 2021-09-14 DIAGNOSIS — Z319 Encounter for procreative management, unspecified: Secondary | ICD-10-CM | POA: Diagnosis not present

## 2022-03-01 DIAGNOSIS — N979 Female infertility, unspecified: Secondary | ICD-10-CM | POA: Diagnosis not present

## 2022-03-07 ENCOUNTER — Other Ambulatory Visit (HOSPITAL_COMMUNITY): Payer: Self-pay

## 2022-03-08 ENCOUNTER — Other Ambulatory Visit (HOSPITAL_COMMUNITY): Payer: Self-pay

## 2022-03-08 MED ORDER — CLOMID 50 MG PO TABS
ORAL_TABLET | ORAL | 0 refills | Status: DC
  Filled 2022-03-08: qty 5, 5d supply, fill #0

## 2022-03-09 ENCOUNTER — Other Ambulatory Visit (HOSPITAL_COMMUNITY): Payer: Self-pay

## 2022-04-06 ENCOUNTER — Other Ambulatory Visit (HOSPITAL_COMMUNITY): Payer: Self-pay

## 2022-04-06 MED ORDER — CLOMID 50 MG PO TABS
50.0000 mg | ORAL_TABLET | ORAL | 0 refills | Status: DC
  Filled 2022-04-06: qty 5, 5d supply, fill #0

## 2022-04-06 MED ORDER — DOXYCYCLINE HYCLATE 100 MG PO CAPS
ORAL_CAPSULE | ORAL | 0 refills | Status: DC
  Filled 2022-04-06: qty 6, 3d supply, fill #0

## 2022-05-07 ENCOUNTER — Other Ambulatory Visit (HOSPITAL_COMMUNITY): Payer: Self-pay

## 2022-05-07 ENCOUNTER — Other Ambulatory Visit (HOSPITAL_BASED_OUTPATIENT_CLINIC_OR_DEPARTMENT_OTHER): Payer: Self-pay

## 2022-05-07 MED ORDER — CLOMID 50 MG PO TABS
50.0000 mg | ORAL_TABLET | Freq: Every day | ORAL | 0 refills | Status: DC
  Filled 2022-05-07: qty 5, 5d supply, fill #0

## 2022-05-08 ENCOUNTER — Other Ambulatory Visit (HOSPITAL_COMMUNITY): Payer: Self-pay

## 2022-05-08 MED ORDER — OVIDREL 250 MCG/0.5ML ~~LOC~~ INJ
0.5000 mL | INJECTION | Freq: Every day | SUBCUTANEOUS | 3 refills | Status: AC
  Filled 2022-05-08: qty 0.5, 30d supply, fill #0
  Filled 2022-07-05: qty 0.5, 30d supply, fill #1

## 2022-05-09 ENCOUNTER — Other Ambulatory Visit (HOSPITAL_COMMUNITY): Payer: Self-pay

## 2022-06-06 ENCOUNTER — Other Ambulatory Visit (HOSPITAL_COMMUNITY): Payer: Self-pay

## 2022-06-06 MED ORDER — CLOMID 50 MG PO TABS
50.0000 mg | ORAL_TABLET | Freq: Every day | ORAL | 0 refills | Status: DC
  Filled 2022-06-06: qty 5, 5d supply, fill #0

## 2022-07-05 ENCOUNTER — Other Ambulatory Visit (HOSPITAL_COMMUNITY): Payer: Self-pay

## 2022-07-05 MED ORDER — CLOMID 50 MG PO TABS
ORAL_TABLET | ORAL | 0 refills | Status: DC
  Filled 2022-07-05: qty 5, 5d supply, fill #0

## 2022-07-06 ENCOUNTER — Other Ambulatory Visit: Payer: Self-pay

## 2022-07-06 ENCOUNTER — Other Ambulatory Visit (HOSPITAL_COMMUNITY): Payer: Self-pay

## 2022-08-06 ENCOUNTER — Other Ambulatory Visit (HOSPITAL_COMMUNITY): Payer: Self-pay

## 2022-08-06 MED ORDER — CLOMID 50 MG PO TABS
ORAL_TABLET | ORAL | 0 refills | Status: DC
  Filled 2022-08-06: qty 5, 5d supply, fill #0

## 2022-09-07 ENCOUNTER — Other Ambulatory Visit (HOSPITAL_COMMUNITY): Payer: Self-pay

## 2022-09-07 MED ORDER — CLOMID 50 MG PO TABS
50.0000 mg | ORAL_TABLET | Freq: Every day | ORAL | 0 refills | Status: DC
  Filled 2022-09-07: qty 5, 5d supply, fill #0

## 2022-09-25 ENCOUNTER — Other Ambulatory Visit (HOSPITAL_COMMUNITY): Payer: Self-pay

## 2022-09-25 MED ORDER — NORETHINDRONE ACETATE 5 MG PO TABS
5.0000 mg | ORAL_TABLET | Freq: Every morning | ORAL | 1 refills | Status: AC
  Filled 2022-09-25: qty 30, 30d supply, fill #0
  Filled 2022-09-25: qty 10, 10d supply, fill #0
  Filled 2022-10-05: qty 30, 30d supply, fill #0

## 2022-10-03 ENCOUNTER — Other Ambulatory Visit (HOSPITAL_COMMUNITY): Payer: Self-pay

## 2022-10-05 ENCOUNTER — Other Ambulatory Visit (HOSPITAL_COMMUNITY): Payer: Self-pay

## 2022-10-05 MED ORDER — METFORMIN HCL 1000 MG PO TABS
ORAL_TABLET | ORAL | 6 refills | Status: AC
  Filled 2022-10-05: qty 35, 30d supply, fill #0
  Filled 2022-10-30: qty 35, 30d supply, fill #1
  Filled 2022-11-02: qty 60, 30d supply, fill #1
  Filled 2022-12-26: qty 60, 30d supply, fill #2
  Filled 2023-04-23: qty 60, 30d supply, fill #3

## 2022-10-12 ENCOUNTER — Other Ambulatory Visit (HOSPITAL_BASED_OUTPATIENT_CLINIC_OR_DEPARTMENT_OTHER): Payer: Self-pay

## 2022-11-02 ENCOUNTER — Other Ambulatory Visit (HOSPITAL_COMMUNITY): Payer: Self-pay

## 2022-12-10 ENCOUNTER — Other Ambulatory Visit (HOSPITAL_BASED_OUTPATIENT_CLINIC_OR_DEPARTMENT_OTHER): Payer: Self-pay

## 2022-12-13 ENCOUNTER — Other Ambulatory Visit (HOSPITAL_COMMUNITY): Payer: Self-pay

## 2022-12-21 ENCOUNTER — Other Ambulatory Visit (HOSPITAL_COMMUNITY): Payer: Self-pay

## 2022-12-27 ENCOUNTER — Other Ambulatory Visit: Payer: Self-pay

## 2022-12-28 ENCOUNTER — Other Ambulatory Visit (HOSPITAL_COMMUNITY): Payer: Self-pay

## 2022-12-28 MED ORDER — ANASTROZOLE 1 MG PO TABS
ORAL_TABLET | ORAL | 2 refills | Status: DC
  Filled 2022-12-28: qty 15, 5d supply, fill #0
  Filled 2023-01-24: qty 15, 5d supply, fill #1
  Filled 2023-02-20: qty 15, 5d supply, fill #2

## 2022-12-29 ENCOUNTER — Other Ambulatory Visit (HOSPITAL_COMMUNITY): Payer: Self-pay

## 2022-12-31 ENCOUNTER — Other Ambulatory Visit (HOSPITAL_COMMUNITY): Payer: Self-pay

## 2022-12-31 MED ORDER — OVIDREL 250 MCG/0.5ML ~~LOC~~ SOSY
250.0000 ug | PREFILLED_SYRINGE | SUBCUTANEOUS | 3 refills | Status: AC
  Filled 2022-12-31 (×2): qty 0.5, 15d supply, fill #0
  Filled 2023-02-01: qty 0.5, 30d supply, fill #0
  Filled 2023-02-28: qty 0.5, 30d supply, fill #1
  Filled 2023-03-26: qty 0.5, 30d supply, fill #2
  Filled 2023-05-24: qty 0.5, 30d supply, fill #3

## 2023-01-09 ENCOUNTER — Other Ambulatory Visit (HOSPITAL_COMMUNITY): Payer: Self-pay

## 2023-01-14 ENCOUNTER — Other Ambulatory Visit (HOSPITAL_COMMUNITY): Payer: Self-pay

## 2023-01-23 ENCOUNTER — Other Ambulatory Visit (HOSPITAL_COMMUNITY): Payer: Self-pay

## 2023-01-23 MED ORDER — DOXYCYCLINE HYCLATE 100 MG PO CAPS
100.0000 mg | ORAL_CAPSULE | Freq: Two times a day (BID) | ORAL | 0 refills | Status: AC
  Filled 2023-01-23: qty 14, 7d supply, fill #0

## 2023-01-23 MED ORDER — MOXIFLOXACIN HCL 400 MG PO TABS
400.0000 mg | ORAL_TABLET | Freq: Every day | ORAL | 0 refills | Status: AC
  Filled 2023-01-23: qty 7, 7d supply, fill #0

## 2023-01-24 ENCOUNTER — Other Ambulatory Visit (HOSPITAL_COMMUNITY): Payer: Self-pay

## 2023-01-28 ENCOUNTER — Other Ambulatory Visit (HOSPITAL_COMMUNITY): Payer: Self-pay

## 2023-02-01 ENCOUNTER — Other Ambulatory Visit (HOSPITAL_COMMUNITY): Payer: Self-pay

## 2023-02-04 ENCOUNTER — Other Ambulatory Visit (HOSPITAL_COMMUNITY): Payer: Self-pay

## 2023-02-20 ENCOUNTER — Other Ambulatory Visit (HOSPITAL_COMMUNITY): Payer: Self-pay

## 2023-02-28 ENCOUNTER — Other Ambulatory Visit (HOSPITAL_COMMUNITY): Payer: Self-pay

## 2023-03-01 ENCOUNTER — Other Ambulatory Visit: Payer: Self-pay

## 2023-03-01 ENCOUNTER — Other Ambulatory Visit (HOSPITAL_COMMUNITY): Payer: Self-pay

## 2023-03-19 ENCOUNTER — Other Ambulatory Visit (HOSPITAL_COMMUNITY): Payer: Self-pay

## 2023-03-19 MED ORDER — ANASTROZOLE 1 MG PO TABS
ORAL_TABLET | ORAL | 2 refills | Status: AC
  Filled 2023-03-19: qty 15, 5d supply, fill #0
  Filled 2023-04-10: qty 15, 5d supply, fill #1
  Filled 2023-05-16: qty 15, 5d supply, fill #2

## 2023-03-19 MED ORDER — GONAL-F RFF REDIJECT 450 UNT/0.75ML ~~LOC~~ SOPN: 375.0000 [IU] | PEN_INJECTOR | Freq: Every day | SUBCUTANEOUS | 2 refills | Status: AC

## 2023-03-22 ENCOUNTER — Other Ambulatory Visit (HOSPITAL_COMMUNITY): Payer: Self-pay

## 2023-03-26 ENCOUNTER — Other Ambulatory Visit (HOSPITAL_COMMUNITY): Payer: Self-pay

## 2023-03-28 ENCOUNTER — Other Ambulatory Visit (HOSPITAL_COMMUNITY): Payer: Self-pay

## 2023-03-29 ENCOUNTER — Other Ambulatory Visit: Payer: Self-pay

## 2023-03-29 ENCOUNTER — Other Ambulatory Visit (HOSPITAL_COMMUNITY): Payer: Self-pay

## 2023-04-11 ENCOUNTER — Other Ambulatory Visit (HOSPITAL_COMMUNITY): Payer: Self-pay

## 2023-04-11 MED ORDER — OVIDREL 250 MCG/0.5ML ~~LOC~~ SOSY
PREFILLED_SYRINGE | SUBCUTANEOUS | 3 refills | Status: AC
  Filled 2023-04-11: qty 0.5, 30d supply, fill #0
  Filled 2023-04-12: qty 0.5, 15d supply, fill #0
  Filled 2023-06-29: qty 0.5, 30d supply, fill #1

## 2023-04-12 ENCOUNTER — Other Ambulatory Visit: Payer: Self-pay

## 2023-04-12 ENCOUNTER — Other Ambulatory Visit (HOSPITAL_COMMUNITY): Payer: Self-pay

## 2023-04-15 ENCOUNTER — Other Ambulatory Visit: Payer: Self-pay

## 2023-04-18 ENCOUNTER — Other Ambulatory Visit (HOSPITAL_COMMUNITY): Payer: Self-pay

## 2023-04-18 MED ORDER — PROGESTERONE 200 MG PO CAPS
200.0000 mg | ORAL_CAPSULE | Freq: Two times a day (BID) | ORAL | 2 refills | Status: AC
  Filled 2023-04-18: qty 30, 15d supply, fill #0
  Filled 2023-05-09: qty 30, 15d supply, fill #1
  Filled 2023-06-14: qty 30, 15d supply, fill #2

## 2023-04-20 ENCOUNTER — Other Ambulatory Visit (HOSPITAL_COMMUNITY): Payer: Self-pay

## 2023-04-24 ENCOUNTER — Other Ambulatory Visit (HOSPITAL_COMMUNITY): Payer: Self-pay

## 2023-04-24 MED ORDER — PROGESTERONE 200 MG PO CAPS
200.0000 mg | ORAL_CAPSULE | Freq: Two times a day (BID) | ORAL | 1 refills | Status: AC
  Filled 2023-04-24: qty 28, 14d supply, fill #0
  Filled 2023-12-18: qty 56, 28d supply, fill #0

## 2023-04-25 ENCOUNTER — Other Ambulatory Visit (HOSPITAL_COMMUNITY): Payer: Self-pay

## 2023-05-09 ENCOUNTER — Other Ambulatory Visit: Payer: Self-pay

## 2023-05-10 ENCOUNTER — Other Ambulatory Visit (HOSPITAL_COMMUNITY): Payer: Self-pay

## 2023-05-17 ENCOUNTER — Other Ambulatory Visit (HOSPITAL_COMMUNITY): Payer: Self-pay

## 2023-05-24 ENCOUNTER — Other Ambulatory Visit (HOSPITAL_COMMUNITY): Payer: Self-pay

## 2023-06-14 ENCOUNTER — Other Ambulatory Visit (HOSPITAL_COMMUNITY): Payer: Self-pay

## 2023-06-20 ENCOUNTER — Other Ambulatory Visit (HOSPITAL_COMMUNITY): Payer: Self-pay

## 2023-06-21 ENCOUNTER — Other Ambulatory Visit (HOSPITAL_COMMUNITY): Payer: Self-pay

## 2023-06-24 ENCOUNTER — Other Ambulatory Visit (HOSPITAL_COMMUNITY): Payer: Self-pay

## 2023-06-24 MED ORDER — ANASTROZOLE 1 MG PO TABS
3.0000 mg | ORAL_TABLET | Freq: Every day | ORAL | 2 refills | Status: AC
  Filled 2023-06-24 – 2023-06-25 (×2): qty 15, 5d supply, fill #0
  Filled 2023-11-18: qty 15, 30d supply, fill #0
  Filled 2023-11-28: qty 15, 28d supply, fill #0

## 2023-06-25 ENCOUNTER — Other Ambulatory Visit (HOSPITAL_COMMUNITY): Payer: Self-pay

## 2023-06-29 ENCOUNTER — Other Ambulatory Visit (HOSPITAL_COMMUNITY): Payer: Self-pay

## 2023-07-01 ENCOUNTER — Other Ambulatory Visit (HOSPITAL_COMMUNITY): Payer: Self-pay

## 2023-07-01 MED ORDER — OVIDREL 250 MCG/0.5ML ~~LOC~~ SOSY
0.5000 mL | PREFILLED_SYRINGE | Freq: Once | SUBCUTANEOUS | 3 refills | Status: AC
  Filled 2023-07-01: qty 0.5, 30d supply, fill #0

## 2023-07-01 MED ORDER — PROGESTERONE 200 MG PO CAPS
200.0000 mg | ORAL_CAPSULE | Freq: Two times a day (BID) | ORAL | 0 refills | Status: AC
  Filled 2023-07-01 – 2023-11-18 (×2): qty 28, 14d supply, fill #0

## 2023-07-05 ENCOUNTER — Other Ambulatory Visit (HOSPITAL_COMMUNITY): Payer: Self-pay

## 2023-07-16 ENCOUNTER — Other Ambulatory Visit (HOSPITAL_COMMUNITY): Payer: Self-pay

## 2023-07-17 ENCOUNTER — Other Ambulatory Visit (HOSPITAL_COMMUNITY): Payer: Self-pay

## 2023-09-04 ENCOUNTER — Other Ambulatory Visit (HOSPITAL_COMMUNITY): Payer: Self-pay

## 2023-09-10 ENCOUNTER — Other Ambulatory Visit (HOSPITAL_COMMUNITY): Payer: Self-pay

## 2023-09-20 ENCOUNTER — Other Ambulatory Visit (HOSPITAL_COMMUNITY): Payer: Self-pay

## 2023-10-24 ENCOUNTER — Other Ambulatory Visit (HOSPITAL_COMMUNITY): Payer: Self-pay

## 2023-10-24 ENCOUNTER — Other Ambulatory Visit (HOSPITAL_BASED_OUTPATIENT_CLINIC_OR_DEPARTMENT_OTHER): Payer: Self-pay

## 2023-10-24 MED ORDER — DOXYCYCLINE MONOHYDRATE 100 MG PO CAPS
100.0000 mg | ORAL_CAPSULE | Freq: Two times a day (BID) | ORAL | 0 refills | Status: DC
  Filled 2023-10-24: qty 14, 7d supply, fill #0

## 2023-11-04 ENCOUNTER — Other Ambulatory Visit (HOSPITAL_COMMUNITY): Payer: Self-pay

## 2023-11-05 ENCOUNTER — Other Ambulatory Visit (HOSPITAL_COMMUNITY): Payer: Self-pay

## 2023-11-05 MED ORDER — ANASTROZOLE 1 MG PO TABS
3.0000 mg | ORAL_TABLET | Freq: Every day | ORAL | 0 refills | Status: AC
  Filled 2023-11-05: qty 15, 30d supply, fill #0

## 2023-11-18 ENCOUNTER — Other Ambulatory Visit (HOSPITAL_COMMUNITY): Payer: Self-pay

## 2023-11-19 ENCOUNTER — Other Ambulatory Visit (HOSPITAL_COMMUNITY): Payer: Self-pay

## 2023-11-19 MED ORDER — METFORMIN HCL 1000 MG PO TABS
1000.0000 mg | ORAL_TABLET | Freq: Two times a day (BID) | ORAL | 6 refills | Status: AC
  Filled 2023-11-19: qty 60, 30d supply, fill #0

## 2023-11-19 MED ORDER — ANASTROZOLE 1 MG PO TABS
ORAL_TABLET | ORAL | 0 refills | Status: AC
  Filled 2023-11-19: qty 15, 30d supply, fill #0
  Filled 2024-01-01: qty 15, 28d supply, fill #0

## 2023-11-19 MED ORDER — PROGESTERONE 200 MG PO CAPS
200.0000 mg | ORAL_CAPSULE | Freq: Two times a day (BID) | ORAL | 0 refills | Status: AC
  Filled 2023-11-19 – 2023-12-18 (×2): qty 28, 14d supply, fill #0

## 2023-11-21 ENCOUNTER — Other Ambulatory Visit (HOSPITAL_COMMUNITY): Payer: Self-pay

## 2023-11-28 ENCOUNTER — Other Ambulatory Visit (HOSPITAL_COMMUNITY): Payer: Self-pay

## 2023-11-29 ENCOUNTER — Other Ambulatory Visit (HOSPITAL_COMMUNITY): Payer: Self-pay

## 2023-12-18 ENCOUNTER — Other Ambulatory Visit (HOSPITAL_COMMUNITY): Payer: Self-pay

## 2023-12-20 ENCOUNTER — Other Ambulatory Visit (HOSPITAL_COMMUNITY): Payer: Self-pay

## 2024-01-01 ENCOUNTER — Other Ambulatory Visit (HOSPITAL_COMMUNITY): Payer: Self-pay

## 2024-02-03 ENCOUNTER — Other Ambulatory Visit (HOSPITAL_COMMUNITY): Payer: Self-pay

## 2024-02-03 MED ORDER — DOXYCYCLINE HYCLATE 100 MG PO TABS
100.0000 mg | ORAL_TABLET | Freq: Two times a day (BID) | ORAL | 0 refills | Status: DC
  Filled 2024-02-03: qty 10, 5d supply, fill #0

## 2024-04-07 ENCOUNTER — Other Ambulatory Visit: Payer: Self-pay | Admitting: Urology

## 2024-04-07 MED ORDER — AZITHROMYCIN 250 MG PO TABS: ORAL_TABLET | ORAL | 0 refills | Status: AC

## 2024-04-07 NOTE — Progress Notes (Signed)
 Albertha called me this morning with report of runny nose, headachs and sore throat for the past week but yesterday she developed thick yellow/green nasal discharge and tenderness over the maxillary sinuses. She denies fever. chills, N/v. She is unable to get in to see her PCP this week so I have offered to send a Rx for Zpak since this has worked well for similar symptoms in the past. She knows to seek evaluation immediately if she develops fevers or chills or if sxs worsen despite treatment.  Sabra MICAEL Rusk, MMS, PA-C [place]  [facility] at [facility] Radiation Oncology Physician Assistant Direct Dial: [phone]  Fax: [phone]
